# Patient Record
Sex: Female | Born: 1945
Health system: Southern US, Community
[De-identification: ages and names within clinical notes are randomized; demographics above are authoritative.]

## PROBLEM LIST (undated history)

## (undated) DIAGNOSIS — M1990 Unspecified osteoarthritis, unspecified site: Secondary | ICD-10-CM

## (undated) DIAGNOSIS — G43909 Migraine, unspecified, not intractable, without status migrainosus: Secondary | ICD-10-CM

## (undated) DIAGNOSIS — R55 Syncope and collapse: Secondary | ICD-10-CM

## (undated) DIAGNOSIS — M810 Age-related osteoporosis without current pathological fracture: Secondary | ICD-10-CM

## (undated) DIAGNOSIS — M5136 Other intervertebral disc degeneration, lumbar region: Secondary | ICD-10-CM

## (undated) DIAGNOSIS — K648 Other hemorrhoids: Secondary | ICD-10-CM

## (undated) DIAGNOSIS — N814 Uterovaginal prolapse, unspecified: Secondary | ICD-10-CM

## (undated) DIAGNOSIS — K449 Diaphragmatic hernia without obstruction or gangrene: Secondary | ICD-10-CM

## (undated) DIAGNOSIS — B029 Zoster without complications: Secondary | ICD-10-CM

## (undated) DIAGNOSIS — T7840XA Allergy, unspecified, initial encounter: Secondary | ICD-10-CM

## (undated) DIAGNOSIS — E785 Hyperlipidemia, unspecified: Secondary | ICD-10-CM

## (undated) DIAGNOSIS — C4491 Basal cell carcinoma of skin, unspecified: Secondary | ICD-10-CM

## (undated) DIAGNOSIS — H11009 Unspecified pterygium of unspecified eye: Secondary | ICD-10-CM

## (undated) DIAGNOSIS — M797 Fibromyalgia: Secondary | ICD-10-CM

## (undated) DIAGNOSIS — Z86010 Personal history of colonic polyps: Secondary | ICD-10-CM

## (undated) DIAGNOSIS — I480 Paroxysmal atrial fibrillation: Secondary | ICD-10-CM

## (undated) DIAGNOSIS — D649 Anemia, unspecified: Secondary | ICD-10-CM

## (undated) DIAGNOSIS — R3129 Other microscopic hematuria: Secondary | ICD-10-CM

## (undated) DIAGNOSIS — K219 Gastro-esophageal reflux disease without esophagitis: Secondary | ICD-10-CM

## (undated) DIAGNOSIS — J189 Pneumonia, unspecified organism: Secondary | ICD-10-CM

## (undated) DIAGNOSIS — J111 Influenza due to unidentified influenza virus with other respiratory manifestations: Secondary | ICD-10-CM

## (undated) DIAGNOSIS — K644 Residual hemorrhoidal skin tags: Secondary | ICD-10-CM

## (undated) DIAGNOSIS — K5790 Diverticulosis of intestine, part unspecified, without perforation or abscess without bleeding: Secondary | ICD-10-CM

## (undated) HISTORY — PX: COLONOSCOPY: SHX174

## (undated) HISTORY — PX: CERVICAL CONE BIOPSY: SUR198

## (undated) HISTORY — DX: Zoster without complications: B02.9

## (undated) HISTORY — DX: Diaphragmatic hernia without obstruction or gangrene: K44.9

## (undated) HISTORY — PX: CHOLECYSTECTOMY: SHX55

## (undated) HISTORY — DX: Unspecified osteoarthritis, unspecified site: M19.90

## (undated) HISTORY — PX: OTHER SURGICAL HISTORY: SHX169

## (undated) HISTORY — DX: Allergy, unspecified, initial encounter: T78.40XA

## (undated) HISTORY — DX: Fibromyalgia: M79.7

## (undated) HISTORY — PX: ABDOMINAL HYSTERECTOMY: SHX81

## (undated) HISTORY — DX: Basal cell carcinoma of skin, unspecified: C44.91

---

## 1999-12-28 DIAGNOSIS — M5136 Other intervertebral disc degeneration, lumbar region: Secondary | ICD-10-CM

## 1999-12-28 DIAGNOSIS — M51369 Other intervertebral disc degeneration, lumbar region without mention of lumbar back pain or lower extremity pain: Secondary | ICD-10-CM

## 1999-12-28 HISTORY — DX: Other intervertebral disc degeneration, lumbar region: M51.36

## 1999-12-28 HISTORY — DX: Other intervertebral disc degeneration, lumbar region without mention of lumbar back pain or lower extremity pain: M51.369

## 2000-03-22 ENCOUNTER — Encounter: Payer: Self-pay | Admitting: Obstetrics and Gynecology

## 2000-03-22 ENCOUNTER — Ambulatory Visit (HOSPITAL_COMMUNITY): Admission: RE | Admit: 2000-03-22 | Discharge: 2000-03-22 | Payer: Self-pay | Admitting: Obstetrics and Gynecology

## 2000-11-12 ENCOUNTER — Encounter: Payer: Self-pay | Admitting: *Deleted

## 2000-11-12 ENCOUNTER — Encounter: Payer: Self-pay | Admitting: Family Medicine

## 2000-11-12 ENCOUNTER — Inpatient Hospital Stay (HOSPITAL_COMMUNITY): Admission: EM | Admit: 2000-11-12 | Discharge: 2000-11-18 | Payer: Self-pay | Admitting: *Deleted

## 2000-11-12 ENCOUNTER — Encounter (INDEPENDENT_AMBULATORY_CARE_PROVIDER_SITE_OTHER): Payer: Self-pay | Admitting: *Deleted

## 2000-11-14 ENCOUNTER — Encounter: Payer: Self-pay | Admitting: Family Medicine

## 2002-04-18 ENCOUNTER — Emergency Department (HOSPITAL_COMMUNITY): Admission: EM | Admit: 2002-04-18 | Discharge: 2002-04-19 | Payer: Self-pay | Admitting: Emergency Medicine

## 2005-08-16 ENCOUNTER — Ambulatory Visit: Payer: Self-pay | Admitting: Internal Medicine

## 2005-08-23 ENCOUNTER — Encounter (INDEPENDENT_AMBULATORY_CARE_PROVIDER_SITE_OTHER): Payer: Self-pay | Admitting: Specialist

## 2005-08-23 ENCOUNTER — Ambulatory Visit: Payer: Self-pay | Admitting: Internal Medicine

## 2005-12-27 DIAGNOSIS — B029 Zoster without complications: Secondary | ICD-10-CM

## 2005-12-27 HISTORY — DX: Zoster without complications: B02.9

## 2008-10-07 ENCOUNTER — Encounter: Admission: RE | Admit: 2008-10-07 | Discharge: 2008-10-07 | Payer: Self-pay | Admitting: Family Medicine

## 2011-05-03 ENCOUNTER — Emergency Department (HOSPITAL_COMMUNITY)
Admission: EM | Admit: 2011-05-03 | Discharge: 2011-05-04 | Disposition: A | Discharge status: Home / Self Care | Payer: 59 | Attending: Emergency Medicine | Admitting: Emergency Medicine

## 2011-05-03 ENCOUNTER — Emergency Department (HOSPITAL_COMMUNITY): Payer: 59

## 2011-05-03 DIAGNOSIS — W2209XA Striking against other stationary object, initial encounter: Secondary | ICD-10-CM | POA: Insufficient documentation

## 2011-05-03 DIAGNOSIS — S92919A Unspecified fracture of unspecified toe(s), initial encounter for closed fracture: Secondary | ICD-10-CM | POA: Insufficient documentation

## 2011-05-03 DIAGNOSIS — IMO0001 Reserved for inherently not codable concepts without codable children: Secondary | ICD-10-CM | POA: Insufficient documentation

## 2011-05-03 DIAGNOSIS — Y92009 Unspecified place in unspecified non-institutional (private) residence as the place of occurrence of the external cause: Secondary | ICD-10-CM | POA: Insufficient documentation

## 2011-05-14 NOTE — Procedures (Signed)
[place]  Patient:    Alicia Bass, Alicia Bass                       MRN: [id number] Proc. Date: 11/14/00 Adm. Date:  [id number] Attending:  Lonia Blood CC:         Dario Guardian, M.D.   Procedure Report  PROCEDURE PERFORMED:  Upper endoscopy with biopsies.  INDICATION:  A 65 year old female recently admitted to the hospital with nonspecific chest pain, who ruled out for an MI and has been having a lot of belching.  Her pain has not improved on TPI therapy.  She also has recently been having diarrhea which is unusual for her.  FINDINGS:  A small hiatal hernia, otherwise normal exam.  DESCRIPTION OF PROCEDURE:  The nature, purpose and risks of the procedure have been discussed with the patient who provided written consent.  She had already received some morphine up on the floor, so Versed 4 mg IV was administered as sedation without arrhythmias or desaturation.  The Olympus video endoscope was passed under direct vision.  The vocal cords looked normal but there was slight erythema of the posterior commissure along with a little bit of edema there, suggesting possible laryngopharyngeal reflux.  The esophagus was entered without difficulty and had normal mucosa without evidence of reflux esophagus, Barretts esophagus, varices, infection or neoplasia.  No ring or stricture was present but there was approximately a 2 to 3 cm hiatal hernia best seen on retroflex viewing in the stomach.  Upon entering the stomach, there was essentially no residual and what appeared to be normal contractility.  No erosions, ulcers, polyps, masses or gastritis were appreciated.  Retroflex viewing apart from the hiatal hernia was normal. The pylorus and duodenal bulb looked normal.  The second duodenum looked like it had a slight decrease in the villous pattern of the mucosa.  Several duodenal biopsies were obtained prior to removing the scope.  The patient  tolerated the procedure well and there were no apparent complications.  IMPRESSION: 1. No obvious evident source of chest and abdominal discomfort identified. 2. Small hiatal hernia. 3. Possible laryngeal changes consistent with laryngopharyngeal reflux.    However, that condition is not really compatible with the character    of her symptoms.  PLAN:  Await pathology on duodenal biopsies.  Continue evaluation for possible biliary tract disease (doubt).  Moreover, I would consider rheumatologic evaluation for musculoskeletal sources of her pain. DD:  11/14/00 TD:  11/15/00 Job: [id number]

## 2011-05-14 NOTE — Consult Note (Signed)
[place]. [facility]  Patient:    Alicia Bass, Alicia Bass                       MRN: [id number] Proc. Date: 11/14/00 Adm. Date:  [id number] Attending:  Lonia Blood CC:         Dario Guardian, M.D.   Consultation Report  REASON FOR CONSULTATION:  Dr. Anastasio Auerbach of the [facility] asked me to see this 65 year old female because of nonspecific chest and abdominal pain.  HISTORY:  Ms. Salo was admitted to the hospital two days ago for rule out MI, and in fact has ruled out with negative cardiac enzymes.  Approximately two days prior to admission, she had the onset of xiphoid-regional chest pain with some radiation to the back, occurring a few hours after consuming a steak dinner with a glass of wine at a restaurant. The pain lasted six hours and kept her from going to sleep until about 3 a.m. and was quite severe.  There was some residual pain the next day, even in the absence of food.  At no time were there fever or chills, but she has noticed a lot of increased belching over the past month or so.  There was no associated diaphoresis, nausea, or vomiting.  Since that time, the patient has had some pain involving the right flank and down the right side of her abdomen and, for the past two to three days, also loose but nonbloody stools.  It sounds as though the patient has had an abdominal ultrasound, although I am unable to find documentation of it; it was reportedly negative.  She also has had CT scan of the abdomen which was essentially negative for any source of pain.  Because of the ongoing character of her symptoms, the presence of belching, the absence of improvement on Protonix in the hospital, endoscopy evaluation has been requested by Dr. Sheppard Penton.  PAST MEDICAL HISTORY:  ALLERGIES:  ASPIRIN.  OUTPATIENT MEDICATIONS:  None.  OPERATIONS:  Total abdominal hysterectomy.  MEDICAL PROBLEMS:  Fibromyalgia, not requiring trigger point  injection.  HABITS:  Nonsmoker, occasional ethanol.  FAMILY HISTORY:  Positive for gallbladder trouble in her sister and ulcers in her brother; negative for colon cancer or liver disease.  SOCIAL HISTORY:  The patient is married with two children.  REVIEW OF SYSTEMS:  No problem with constipation or obvious heartburn or regurgitation problems, despite the belching.  No history of chronic abdominal pain.  PHYSICAL EXAMINATION:  Unrevealing.  GENERAL:  A pleasant female in no evident distress.  Her pupils are small status post morphine injection, but she is very alert, awake, and coherent. She is anicteric and without pallor.  CHEST:  Clear.  HEART:  Without gallops, rubs, murmurs, clicks, or arrhythmias.  ABDOMEN:  Active bowel sounds and no organomegaly, guarding, mass, or tenderness.  LABORATORY DATA:  Hemoglobin 11.1, white count 4500.  Platelets 217,000. Liver chemistry normal x 2.  Renal function normal.  Lipase normal.  IMPRESSION: 1. Upper abdominal pain with some radiation to the back in a patient with a    family history of gallstones. 2. Recent belching of about one months duration without frank heartburn or    reflux. 3. Diarrhea of roughly two days duration. 4. Background diagnosis of fibromyalgia.  PLAN:  I agree with Dr. Sheppard Penton that endoscopy evaluation is reasonable to help sort things out, with further management to depend on those findings.  Also, we need  to confirm whether or not the patient has indeed had an abdominal ultrasound because, if not, I think there is a reasonable possibility that this is biliary tract disease in view of the character, duration, and location of the pain and the family history of gallbladder problems.  Finally, the patient is a candidate for colon cancer screening procedure, such as hemoccults and flexible sigmoidoscopy, if these have not already been performed through the office of her primary physician. DD:  11/14/00 TD:   11/15/00 Job: [id number]

## 2011-05-14 NOTE — Consult Note (Signed)
[place]  Patient:    KENNADY, ZIMMERLE                       MRN: [id number] Proc. Date: 11/15/00 Adm. Date:  [id number] Attending:  Lonia Blood CC:         Dario Guardian, M.D.  Florencia Reasons, M.D.  Miguel Aschoff, M.D.   Consultation Report  HISTORY OF PRESENT ILLNESS:  I was asked by Dr. Mayford Knife to evaluate Ms. Rouse for persistent abdominal pain.  This is a nice 65 year old white female who 1 week ago after eating a meal of steak and potatoes developed a fairly rapid onset of epigastric pain.  She describes a cramping or sharp pain in her low substernum and epigastrium which radiated down along her right flank to her low right back.  This has not been associated with any nausea or vomiting.  The pain persisted for about 24 hours and she did not feel like eating.  The pain then improved and seemed like it was resolving but again, after eating a high fat meal, on the day of admission, she developed recurrent pain and presented to the emergency room.  The patient has been having some mild similar discomfort for about a month. She reports that she had an episode of epigastric pain about 20 years ago and had an upper GI series and was told she had a hiatal hernia.  She, however, has not had any significant abdominal pain or GI symptoms for many years until this illness.  Since admission her pain has been fairly persistent although relieved with medications.  She has had onset of diarrhea, just prior admission which has persisted as well.  She denies fever or chills.  No evidence of jaundice, no urinary symptoms.  Since admission she has had a extensive work-up.  LFTs have all been normal. CBC is normal with a white count of 4.5, and hematocrit somewhat low at 33%. Urinalysis was negative.  Cardiac work-up was negative.  Lipase normal at 28.  X-ray work-up includes a normal abdominal ultrasound, a normal CT scan of  the abdomen and pelvis and a normal HIDA scan.  She has been evaluated by Dr. Matthias Hughs and an upper endoscopy has been performed which revealed small hiatal hernia and some slight laryngeal irritation possibly consistent with reflux although no definite cause for her pain was identified.  Duodenal biopsy was obtained which is pending.  PAST MEDICAL HISTORY:  She is followed by Dr. Dario Guardian.  She has a diagnosis of fibromyalgia, and has had some extremity pain but is on no medication for this.  She has had a vaginal hysterectomy.  She is on no regular medications prior to admission.  ALLERGIES:  ASPIRIN.  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  Drinks occasional small amounts of alcohol.  Does not smoke cigarettes.  FAMILY HISTORY:  Significant for a sister with gallbladder history.  PHYSICAL EXAMINATION:  VITAL SIGNS:  She is afebrile.  All within normal limits.  GENERAL:  She is a well-developed white female and does not appear in acute distress.  SKIN:  Warm and dry without rash or infection.  HEENT:  No adenopathy.  Sclerae nonicteric.  LUNGS:  Clear to auscultation.  CARDIAC:  Regular rate and rhythm without murmurs.  ABDOMEN:  Soft and nondistended.  A very mild epigastric tenderness with no guarding.  No palpable masses or hepatosplenomegaly.  ASSESSMENT AND PLAN:  Abdominal pain which is certainly consistent with biliary pain in its location and character.  She has had a negative extensive work-up.  She certainly could have a calculus cholecystitis or biliary dyskinesia.  At this point she is fairly early in her acute illness and I would not recommend surgical intervention at this point with a negative work up.  I would favor continued treatment with acid reduction and symptomatic treatment for her pain and close follow up.  Certainly if this becomes a persistent problem for her over the next several weeks or a recurring problem with no  other cause identified, consideration could be given empiric laparoscopic cholecystectomy. I will remain available as needed. DD:  11/15/00 TD:  11/16/00 Job: [id number]

## 2011-05-14 NOTE — Discharge Summary (Signed)
[place]  Patient:    Alicia Bass, Alicia Bass                       MRN: [id number] Adm. Date:  [id number] Disc. Date: [id number] Attending:  Lonia Blood CC:         Dario Guardian, M.D.  Florencia Reasons, M.D.  Lorne Skeens. [place], M.D.   Discharge Summary  DATE OF BIRTH:  01-02-1946  DISCHARGE DIAGNOSES: 1. Severe abdominal pain.    A. Etiology unclear.    B. Extensive gastrointestinal workup without significant abnormality. 2. Chest and rib pain - ? costochondritis. 3. Hiatal hernia. 4. Laryngopharyngeal reflux by endoscopy, suggested. 5. Mild anemia.    A. Admission hemoglobin 23.4, discharge hemoglobin 11.4.    B. Question secondary to acute illness. 6. Diarrhea, resolved - stool studies negative. 7. Fibromyalgia - primarily presents with extremity pain. 8. Status post vaginal hysterectomy. 9. Allergy/intolerance:  ASPIRIN (abdominal pain).  DISCHARGE MEDICATIONS: 1. Reglan 5 mg one p.o. q.a.c. and h.s. (q.i.d.). 2. Protonix 40 mg p.o. b.i.d. x 5 days, then q.d. 3. Vioxx 25 mg two p.o. q.d. x 5 days, then q.d. x 5 days.  (The patient is    to see Dr. Katrinka Blazing before taking any more than that.) 4. Use Tylenol for breakthrough pain, avoid aspirin and Motrin-like products    except for Vioxx listed above.  CONDITION UPON DISCHARGE:  Stable.  Alicia Bass is finally off IV pain medications and is tolerating liquids.  DISCHARGE INSTRUCTIONS:  Recommended activity:  As tolerated.  Recommended diet:  Small frequent meals.  SPECIAL DISCHARGE INSTRUCTIONS:  Call Dr. Katrinka Blazing or return if symptoms recur.  DISCHARGE FOLLOWUP:  The patient is to call Dr. Betsy Pries office to schedule and appointment to be seen next Thursday to reevaluate pain.  She should also have hemoglobin repeated in approximately two to three weeks to ensure that the anemia has resolved.  CONSULTANTS: 1. Robert Buccini. 2. Glenna Fellows.  PROCEDURES: 1. EKG (November 17):  Normal sinus rhythm with occasional PVC, otherwise    normal. 2. Chest x-ray (November 17):  Hyperinflation without acute or superimposed    abnormality. 3. Abdominal and pelvic CT with contrast (November 17):  A few tiny (5 mm or    less) lucencies of the liver that are probably cysts.  Unlikely of clinical    significance.  Pelvic CT reveals no abnormalities. 4. Abdominal ultrasound (November 19):  Normal. 5. Hepatobiliary scan (November 21):  Normal. 6. Lumbar spine films (November 20):  Osteopenia.  No compression fractures or    subluxations.  Minimal osteophytes in the thoracic spine.  Mild facet    degenerative disease at L4-L5 and L5-S1. 7. Barium swallow (November 21):  Negative.  No evidence of obstruction or    abnormal peristalsis.  Barium tablets are not available, but the patient    swallowed a coated marshmallow without difficulty. 8. Esophagogastroduodenoscopy (November 19):  Essentially normal exam.  There    was as slight increase in the duodenal villi (biopsy showed no microscopic    abnormality).  There was also laryngeal inflammation.  No evident cause for    chest or abdominal pain seen.  HOSPITAL COURSE: #1 -  ABDOMINAL AND CHEST PAIN:  Alicia Bass is a 65 year old Caucasian female who presents with nonspecific lower chest and upper abdominal pain.  EKG and cardiac enzymes done on admission were negative.  Her pain began  approximately three days prior to presentation after she had eaten a meal.  It had been intermittent, but got to the point where she was doubled over with discomfort today.  She had transient nausea and vomiting, but no shortness of breath or diaphoresis.  She also had some diarrhea associated with her symptoms. Abdominal CT was obtained on admission which showed no significant abnormalities.  This was followed by abdominal ultrasound looking for biliary disease given the nature of her story.  This  was also negative.  During this time, she was still having significant pain requiring IV pain medications. Because of our concerns for acalculous cholecystitis, we did obtain a PIPIDA scan which was also normal.  Occasionally people can have biliary colic without abnormal tests.  I consulted Dr. Glenna Fellows, general surgeon, to review the patient and studies done.  Persistence in this workup was driven by the fact that Alicia Bass was still having severe colicky pain requiring IV pain medication, and she was not eating or drinking.  Dr. Johna Sheriff felt that she certainly could have acalculous cholecystitis or biliary dyskinesia, but felt that she was early in her acute illness and would not recommend surgical intervention at this time.  He recommended continuing treatment with acid reduction and symptomatic treatment for her pain.  If it becomes a persistent problem over the next several weeks or a recurrent problem without other cause identifying data, he would consider empiric laparoscopic cholecystectomy.  Alicia Bass continued to have severe pain and on the morning of November 21, she told me it was worse than it had ever been. I decided to order a barium swallow at that time to look for evidence of esophageal spasm as a possible cause for her colicky type pain which was in the anterior sternal area radiating through to the back.  This was normal.  She also had persistent belching with this.  Because of the small hiatal hernia, I did opt to place her on some Reglan, and of course, we continued her IV pain medication as nothing else oral was helping.  Over the next 48 hours her symptoms slowly improved, and at the time of discharge she was tolerating liquids and the pain was manageable with oral medications.  Of note, she did have some chest wall tenderness associated with this, and we opted to place her on an  anti-inflammatory as well (Vioxx).  She will follow up with Dr. Merri Brunette in one week and have symptoms reassessed.  #2 - CHEST WALL PAIN:  Possible costochondritis.  I began anti-inflammatory treatment.  She is also on Protonix for above-listed reasons.  #3 - ANEMIA:  Alicia Bass had a hemoglobin of 13.4 on admission with an MCV of 87.  Throughout this hospital stay, it did drop to 11.4.  She had no evidence of bleeding.  Follow up as an outpatient. DD:  11/18/00 TD:  11/19/00 Job: [id number]

## 2011-12-01 ENCOUNTER — Ambulatory Visit (INDEPENDENT_AMBULATORY_CARE_PROVIDER_SITE_OTHER): Payer: 59

## 2011-12-01 DIAGNOSIS — Z0000 Encounter for general adult medical examination without abnormal findings: Secondary | ICD-10-CM

## 2011-12-01 DIAGNOSIS — Z13220 Encounter for screening for lipoid disorders: Secondary | ICD-10-CM

## 2011-12-01 DIAGNOSIS — R5381 Other malaise: Secondary | ICD-10-CM

## 2011-12-01 DIAGNOSIS — IMO0001 Reserved for inherently not codable concepts without codable children: Secondary | ICD-10-CM

## 2012-02-21 ENCOUNTER — Other Ambulatory Visit: Payer: Self-pay | Admitting: Dermatology

## 2012-09-25 ENCOUNTER — Ambulatory Visit: Payer: 59 | Admitting: Internal Medicine

## 2012-12-12 ENCOUNTER — Ambulatory Visit (INDEPENDENT_AMBULATORY_CARE_PROVIDER_SITE_OTHER): Payer: 59 | Admitting: Physician Assistant

## 2012-12-12 ENCOUNTER — Encounter: Payer: Self-pay | Admitting: Physician Assistant

## 2012-12-12 VITALS — BP 127/74 | HR 64 | Temp 97.8°F | Resp 16 | Ht 62.75 in | Wt 138.0 lb

## 2012-12-12 DIAGNOSIS — N8111 Cystocele, midline: Secondary | ICD-10-CM

## 2012-12-12 DIAGNOSIS — M8580 Other specified disorders of bone density and structure, unspecified site: Secondary | ICD-10-CM

## 2012-12-12 DIAGNOSIS — IMO0001 Reserved for inherently not codable concepts without codable children: Secondary | ICD-10-CM

## 2012-12-12 DIAGNOSIS — Z0000 Encounter for general adult medical examination without abnormal findings: Secondary | ICD-10-CM

## 2012-12-12 DIAGNOSIS — K449 Diaphragmatic hernia without obstruction or gangrene: Secondary | ICD-10-CM | POA: Insufficient documentation

## 2012-12-12 DIAGNOSIS — IMO0002 Reserved for concepts with insufficient information to code with codable children: Secondary | ICD-10-CM

## 2012-12-12 DIAGNOSIS — M797 Fibromyalgia: Secondary | ICD-10-CM | POA: Insufficient documentation

## 2012-12-12 DIAGNOSIS — N8110 Cystocele, unspecified: Secondary | ICD-10-CM | POA: Insufficient documentation

## 2012-12-12 DIAGNOSIS — M810 Age-related osteoporosis without current pathological fracture: Secondary | ICD-10-CM | POA: Insufficient documentation

## 2012-12-12 HISTORY — PX: COLONOSCOPY W/ POLYPECTOMY: SHX1380

## 2012-12-12 LAB — POCT URINALYSIS DIPSTICK
Bilirubin, UA: NEGATIVE
Nitrite, UA: NEGATIVE
Protein, UA: NEGATIVE
pH, UA: 6.5

## 2012-12-12 LAB — CBC WITH DIFFERENTIAL/PLATELET
Basophils Absolute: 0 10*3/uL (ref 0.0–0.1)
Basophils Relative: 1 % (ref 0–1)
Eosinophils Absolute: 0.1 10*3/uL (ref 0.0–0.7)
Hemoglobin: 13.9 g/dL (ref 12.0–15.0)
MCH: 30.3 pg (ref 26.0–34.0)
MCHC: 33.4 g/dL (ref 30.0–36.0)
Monocytes Relative: 8 % (ref 3–12)
Neutro Abs: 2.4 10*3/uL (ref 1.7–7.7)
Neutrophils Relative %: 52 % (ref 43–77)
Platelets: 255 10*3/uL (ref 150–400)
RDW: 13.1 % (ref 11.5–15.5)

## 2012-12-12 LAB — COMPREHENSIVE METABOLIC PANEL
ALT: 30 U/L (ref 0–35)
AST: 24 U/L (ref 0–37)
Chloride: 108 mEq/L (ref 96–112)
Creat: 0.83 mg/dL (ref 0.50–1.10)
Sodium: 142 mEq/L (ref 135–145)
Total Bilirubin: 0.6 mg/dL (ref 0.3–1.2)
Total Protein: 6.3 g/dL (ref 6.0–8.3)

## 2012-12-12 LAB — POCT UA - MICROSCOPIC ONLY: Yeast, UA: NEGATIVE

## 2012-12-12 LAB — TSH: TSH: 1.591 u[IU]/mL (ref 0.350–4.500)

## 2012-12-12 LAB — LIPID PANEL
Total CHOL/HDL Ratio: 3.6 Ratio
VLDL: 21 mg/dL (ref 0–40)

## 2012-12-12 NOTE — Patient Instructions (Signed)
Take the prescription for Zostavax (the shingles vaccine) to [facility], [facility] or [facility].  Keeping You Healthy  Get These Tests  Blood Pressure- Have your blood pressure checked by your healthcare provider at least once a year.  Normal blood pressure is 120/80.  Weight- Have your body mass index (BMI) calculated to screen for obesity.  BMI is a measure of body fat based on height and weight.  You can calculate your own BMI at https://www.west-esparza.com/  Cholesterol- Have your cholesterol checked every year.  Diabetes- Have your blood sugar checked every year if you have high blood pressure, high cholesterol, a family history of diabetes or if you are overweight.  Pap Smear- Have a pap smear every 1 to 3 years if you have been sexually active.  If you are older than 65 and recent pap smears have been normal you may not need additional pap smears.  In addition, if you have had a hysterectomy  For benign disease additional pap smears are not necessary.  Mammogram-Yearly mammograms are essential for early detection of breast cancer  Screening for Colon Cancer- Colonoscopy starting at age 64. Screening may begin sooner depending on your family history and other health conditions.  Follow up colonoscopy as directed by your Gastroenterologist.  Screening for Osteoporosis- Screening begins at age 64 with bone density scanning, sooner if you are at higher risk for developing Osteoporosis.  Get these medicines  Calcium with Vitamin D- Your body requires 1200-1500 mg of Calcium a day and [phone] IU of Vitamin D a day.  You can only absorb 500 mg of Calcium at a time therefore Calcium must be taken in 2 or 3 separate doses throughout the day.  Hormones- Hormone therapy has been associated with increased risk for certain cancers and heart disease.  Talk to your healthcare provider about if you need relief from menopausal symptoms.  Aspirin- Ask your healthcare provider about taking Aspirin  to prevent Heart Disease and Stroke.  Get these Immuniztions  Flu shot- Every fall  Pneumonia shot- Once after the age of 50; if you are younger ask your healthcare provider if you need a pneumonia shot.  Tetanus- Every ten years.  Zostavax- Once after the age of 64 to prevent shingles.  Take these steps  Don't smoke- Your healthcare provider can help you quit. For tips on how to quit, ask your healthcare provider or go to www.smokefree.gov or call 1-800 QUIT-NOW.  Be physically active- Exercise 5 days a week for a minimum of 30 minutes.  If you are not already physically active, start slow and gradually work up to 30 minutes of moderate physical activity.  Try walking, dancing, bike riding, swimming, etc.  Eat a healthy diet- Eat a variety of healthy foods such as fruits, vegetables, whole grains, low fat milk, low fat cheeses, yogurt, lean meats, chicken, fish, eggs, dried beans, tofu, etc.  For more information go to www.thenutritionsource.org  Dental visit- Brush and floss teeth twice daily; visit your dentist twice a year.  Eye exam- Visit your Optometrist or Ophthalmologist yearly.  Drink alcohol in moderation- Limit alcohol intake to one drink or less a day.  Never drink and drive.  Depression- Your emotional health is as important as your physical health.  If you're feeling down or losing interest in things you normally enjoy, please talk to your healthcare provider.  Seat Belts- can save your life; always wear one  Smoke/Carbon Monoxide detectors- These detectors need to be installed on the appropriate  level of your home.  Replace batteries at least once a year.  Violence- If anyone is threatening or hurting you, please tell your healthcare provider.  Living Will/ Health care power of attorney- Discuss with your healthcare provider and family.

## 2012-12-12 NOTE — Progress Notes (Signed)
Subjective:    Patient ID: Alicia Bass, female    DOB: 1946-10-28, 66 y.o.   MRN: [id number]  HPI  This 66 y.o. female presents for Annual Wellness Examination.  Overall, she's doing well, but complains of pain in both elbows and the RIGHT upper arm.  No numbness or tingling, no weakness.  "I think it's the fibromyalgia" but she recalls hitting her LEFT elbow on a table last week. She's done that before, too.  She is left hand dominant and works as a Dispensing optician at [employer].  Colonoscopy 08/21/2005. Mammogram 2011.  Past Medical History  Diagnosis Date  . Fibromyalgia   . Basal cell carcinoma     LEFT anterior chest wall  . Shingles 2007  . Hiatal hernia     Past Surgical History  Procedure Date  . Abdominal hysterectomy   . Gallbladder surgery     Prior to Admission medications   Medication Sig Start Date End Date Taking? Authorizing Provider  Ascorbic Acid (VITAMIN C PO) Take by mouth daily.   Yes Historical Provider, MD  aspirin 81 MG tablet Take 81 mg by mouth daily.   Yes Historical Provider, MD  BIOTIN PO Take by mouth daily.   Yes Historical Provider, MD  Cholecalciferol (VITAMIN D PO) Take by mouth daily.   Yes Historical Provider, MD  glucosamine-chondroitin 500-400 MG tablet Take 1 tablet by mouth 3 (three) times daily.   Yes Historical Provider, MD  Multiple Vitamin (MULTIVITAMIN) tablet Take 1 tablet by mouth daily.   Yes Historical Provider, MD    Allergies  Allergen Reactions  . Codeine     History   Social History  . Marital Status: Married    Spouse Name: Nafeesa Dils    Number of Children: 5  . Years of Education: 12   Occupational History  . hairdresser    Social History Main Topics  . Smoking status: Never Smoker   . Smokeless tobacco: Never Used  . Alcohol Use: Yes     Comment: 1-2 annually  . Drug Use: No  . Sexually Active: Yes -- Female partner(s)    Birth Control/ Protection: Surgical   Other Topics  Concern  . Not on file   Social History Narrative   Lives with husband.  Square dances and rides motorcycles with her husband. Her daughter and 2 grandchildren live with them.  Her son died by suicide at age 13.  Her husband also has 3 children from a previous relationship.    Family History  Problem Relation Age of Onset  . Heart disease Mother   . Cancer Father   . Cancer Sister     "full blown cancer," "it started as a squeamish on her head"  . Heart disease Sister   . Diabetes Sister   . Heart disease Brother   . Early death Son   . Cancer Maternal Uncle   . Kidney disease Paternal Aunt   . Stroke Sister   . Heart disease Sister     Review of Systems As above, No chest pain, SOB, HA, dizziness, vision change, N/V, diarrhea, constipation, dysuria, urinary urgency or frequency, or rash.     Objective:   Physical Exam  Vitals reviewed. Constitutional: She is oriented to person, place, and time. Vital signs are normal. She appears well-developed and well-nourished. No distress.  HENT:  Head: Normocephalic and atraumatic.  Right Ear: Hearing, tympanic membrane, external ear and ear canal normal. No foreign bodies.  Left  Ear: Hearing, tympanic membrane, external ear and ear canal normal. No foreign bodies.  Nose: Nose normal.  Mouth/Throat: Uvula is midline, oropharynx is clear and moist and mucous membranes are normal. No oral lesions. Normal dentition. No dental abscesses or uvula swelling. No oropharyngeal exudate.  Eyes: Conjunctivae normal, EOM and lids are normal. Pupils are equal, round, and reactive to light. Right eye exhibits no discharge. Left eye exhibits no discharge. No scleral icterus.  Fundoscopic exam:      The right eye shows no arteriolar narrowing, no AV nicking, no exudate, no hemorrhage and no papilledema. The right eye shows red reflex.The right eye shows no venous pulsations.      The left eye shows no arteriolar narrowing, no AV nicking, no exudate, no  hemorrhage and no papilledema. The left eye shows red reflex.The left eye shows no venous pulsations.   Neck: Trachea normal, normal range of motion and full passive range of motion without pain. Neck supple. No spinous process tenderness and no muscular tenderness present. No mass and no thyromegaly present.  Cardiovascular: Normal rate, regular rhythm, normal heart sounds, intact distal pulses and normal pulses.   Pulmonary/Chest: Effort normal and breath sounds normal. Right breast exhibits no inverted nipple, no mass, no nipple discharge, no skin change and no tenderness. Left breast exhibits no inverted nipple, no mass, no nipple discharge, no skin change and no tenderness. Breasts are symmetrical.  Genitourinary: Rectum normal and vagina normal.  Musculoskeletal: Normal range of motion. She exhibits no edema and no tenderness.       Right shoulder: Normal.       Left shoulder: Normal.       Right elbow: She exhibits normal range of motion, no swelling, no effusion, no deformity and no laceration. tenderness found. Lateral epicondyle tenderness noted. No radial head, no medial epicondyle and no olecranon process tenderness noted.       Left elbow: She exhibits normal range of motion, no swelling, no effusion, no deformity and no laceration. tenderness found. Lateral epicondyle tenderness noted. No radial head, no medial epicondyle and no olecranon process tenderness noted.       Right wrist: Normal.       Left wrist: Normal.       Cervical back: Normal.       Thoracic back: Normal.       Lumbar back: Normal.       Right upper arm: She exhibits tenderness. She exhibits no bony tenderness, no swelling, no edema, no deformity and no laceration.       Left upper arm: Normal.       Right hand: Normal. normal sensation noted. Normal strength noted.       Left hand: Normal. normal sensation noted. Normal strength noted.  Lymphadenopathy:       Head (right side): No tonsillar, no preauricular, no  posterior auricular and no occipital adenopathy present.       Head (left side): No tonsillar, no preauricular, no posterior auricular and no occipital adenopathy present.    She has no cervical adenopathy.    She has no axillary adenopathy.       Right: No supraclavicular adenopathy present.       Left: No supraclavicular adenopathy present.  Neurological: She is alert and oriented to person, place, and time. She has normal strength and normal reflexes. No cranial nerve deficit. She exhibits normal muscle tone. Coordination and gait normal.  Skin: Skin is warm, dry and intact. No rash noted.  She is not diaphoretic. No cyanosis or erythema. Nails show no clubbing.  Psychiatric: She has a normal mood and affect. Her speech is normal and behavior is normal. Judgment and thought content normal.    Results for orders placed in visit on 12/12/12  POCT UA - MICROSCOPIC ONLY      Component Value Range   WBC, Ur, HPF, POC 1-5     RBC, urine, microscopic 0-3     Bacteria, U Microscopic trace     Mucus, UA trace     Epithelial cells, urine per micros 0-5     Crystals, Ur, HPF, POC neg     Casts, Ur, LPF, POC neg     Yeast, UA neg    POCT URINALYSIS DIPSTICK      Component Value Range   Color, UA yellow     Clarity, UA clear     Glucose, UA neg     Bilirubin, UA neg     Ketones, UA neg     Spec Grav, UA 1.025     Blood, UA neg     pH, UA 6.5     Protein, UA neg     Urobilinogen, UA 0.2     Nitrite, UA neg     Leukocytes, UA small (1+)        Assessment & Plan:   1. Routine general medical examination at a health care facility  CBC with Differential, Comprehensive metabolic panel, Lipid panel, TSH; Age appropriate anticipatory guidance provided. She needs an updated Mammogram.  2. Fibromyalgia  Encouraged her to use NSAIDS prn.  3. Bladder cystocele  POCT UA - Microscopic Only, POCT urinalysis dipstick  4. Osteopenia  She needs an updated DEXA

## 2012-12-13 ENCOUNTER — Encounter: Payer: Self-pay | Admitting: Physician Assistant

## 2013-02-19 ENCOUNTER — Other Ambulatory Visit: Payer: Self-pay | Admitting: Dermatology

## 2013-02-24 IMAGING — CR DG TOE 5TH 2+V*L*
3 series · 3 of 3 positions shown · non-contrast
Comparison: None

CLINICAL DATA: Pain, trauma, tenderness fifth toe

LEFT TOE - 2+ VIEW

[t toes ap left]
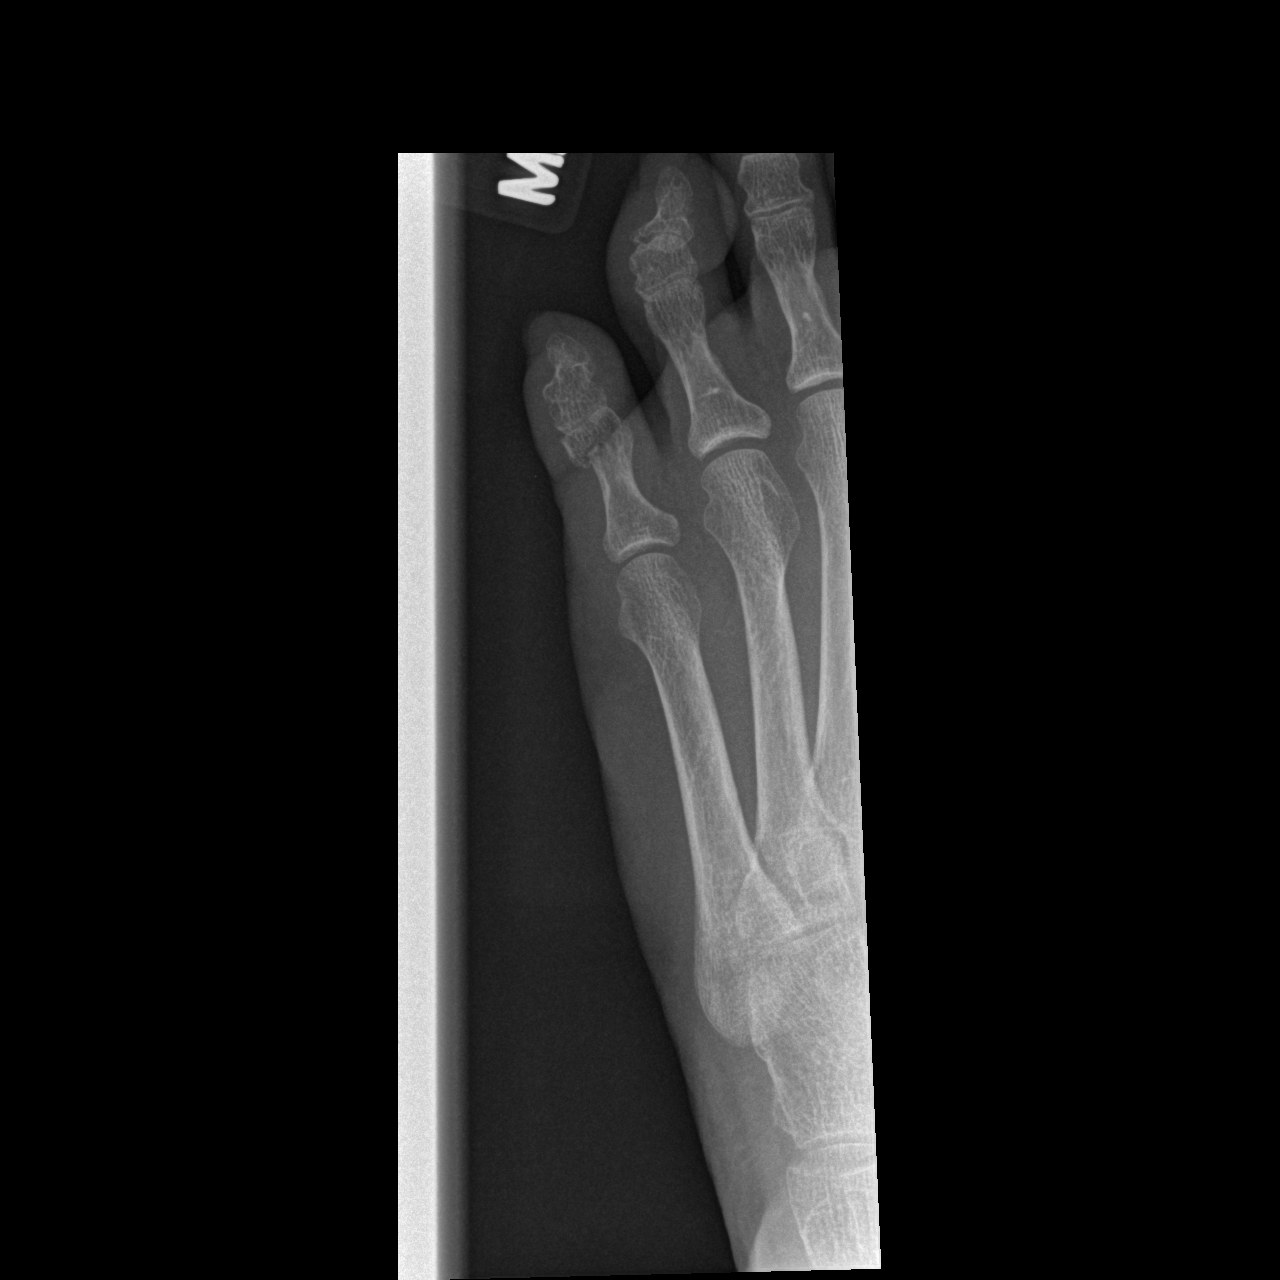

[t toes oblique left]
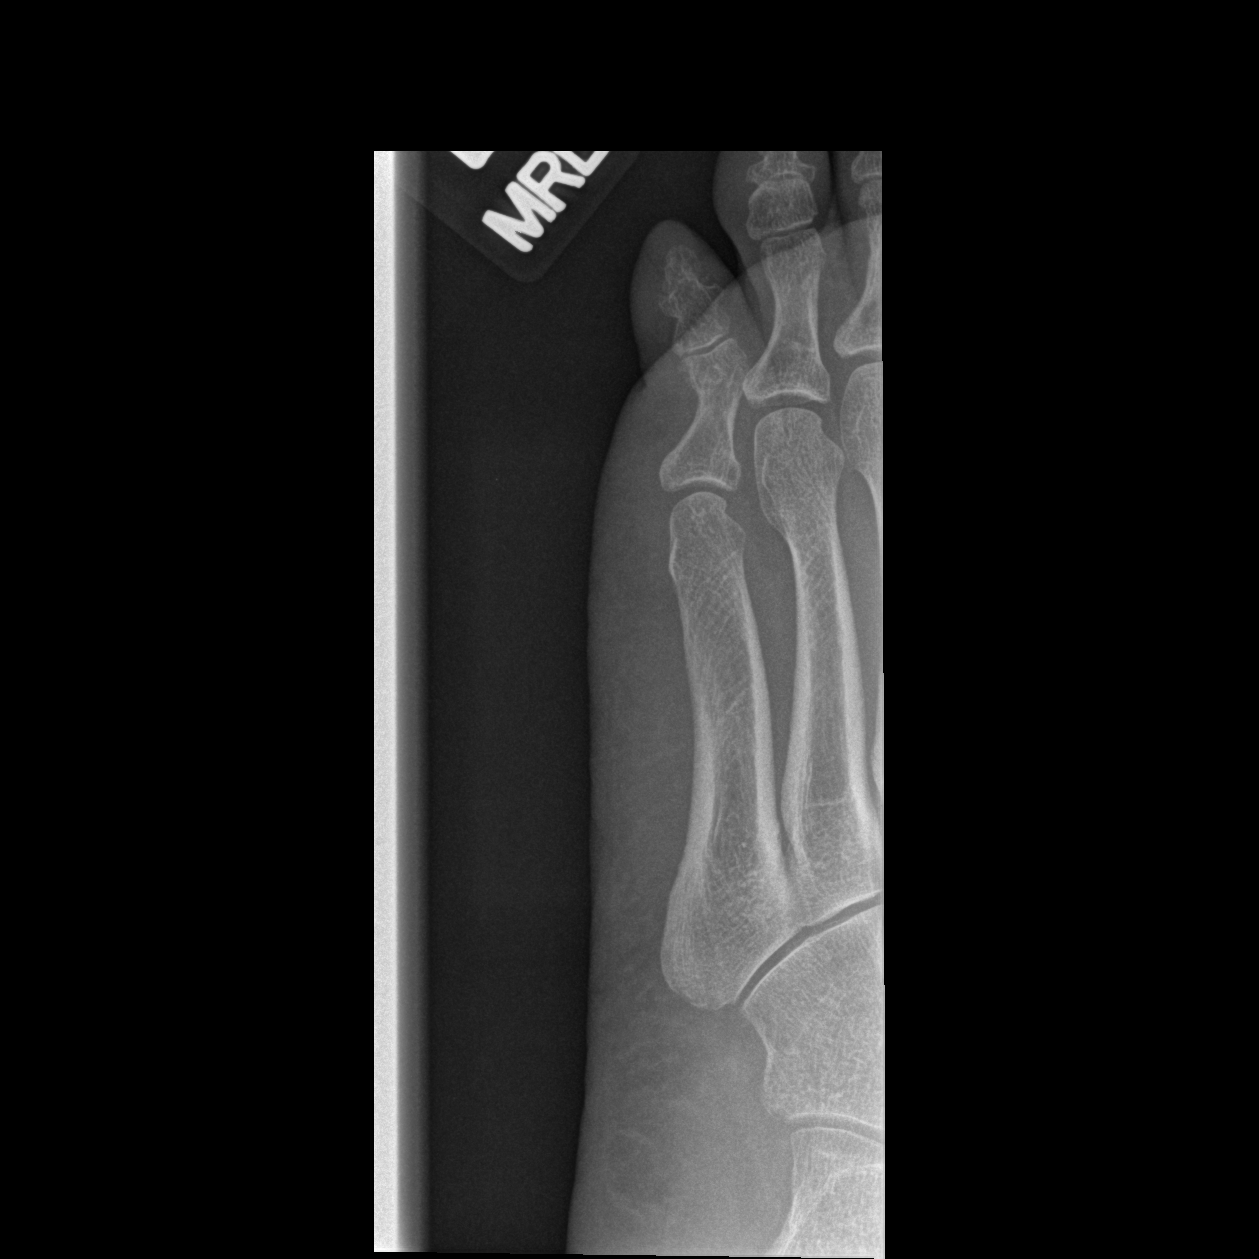

[t toes lateral left]
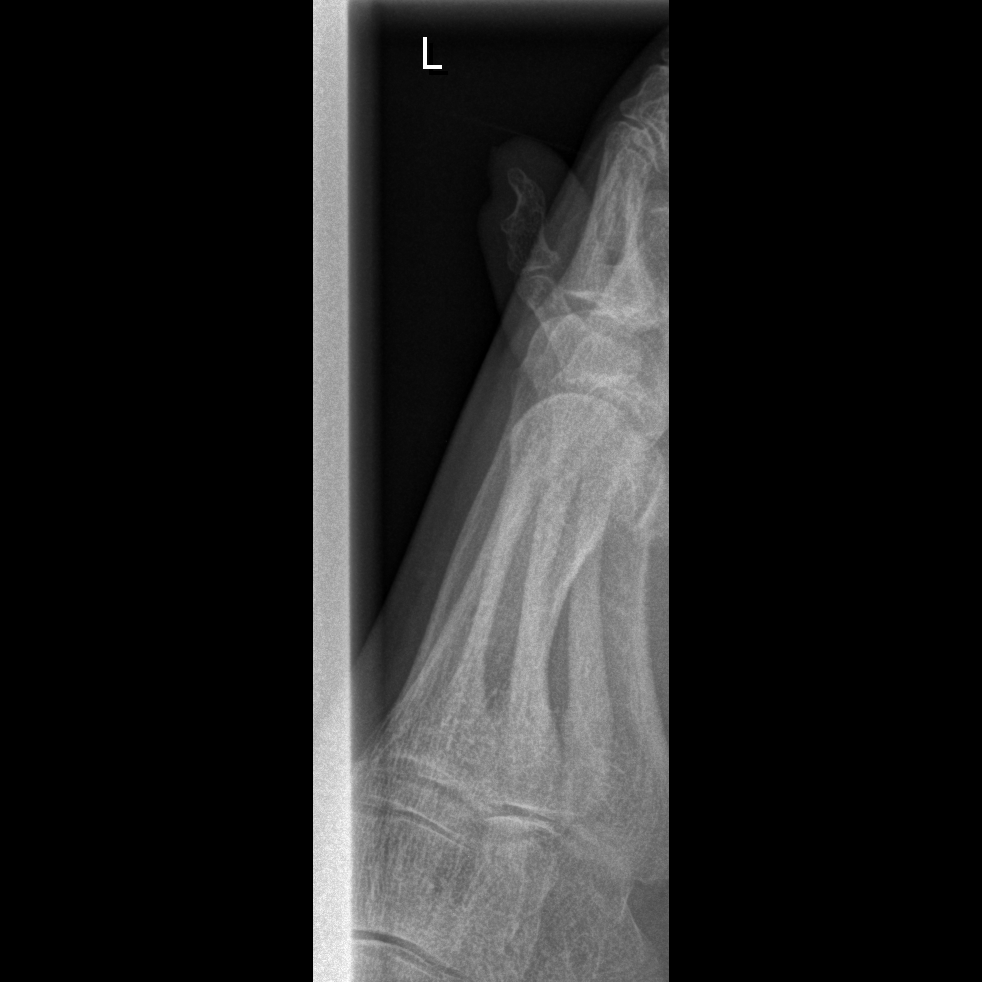

[3 of 3 positions shown; findings below may reference images not displayed]

FINDINGS: Osseous demineralization.
Joint spaces preserved.
Transverse fracture at distal aspect of proximal phalanx left fifth
toe, slightly displaced laterally.
No definite intra-articular extension at PIP joint.
No additional fracture or dislocation seen.
IMPRESSION: Nondisplaced fracture at distal aspect of proximal phalanx left
fifth toe.

## 2013-09-12 ENCOUNTER — Encounter: Payer: Self-pay | Admitting: Family Medicine

## 2013-10-24 ENCOUNTER — Encounter: Payer: Self-pay | Admitting: *Deleted

## 2013-11-27 ENCOUNTER — Encounter: Payer: Self-pay | Admitting: Physician Assistant

## 2013-11-27 ENCOUNTER — Ambulatory Visit (INDEPENDENT_AMBULATORY_CARE_PROVIDER_SITE_OTHER): Payer: Medicare Other | Admitting: Emergency Medicine

## 2013-11-27 VITALS — BP 110/66 | HR 55 | Temp 97.9°F | Resp 16 | Ht 62.5 in | Wt 138.2 lb

## 2013-11-27 DIAGNOSIS — IMO0001 Reserved for inherently not codable concepts without codable children: Secondary | ICD-10-CM

## 2013-11-27 DIAGNOSIS — G8929 Other chronic pain: Secondary | ICD-10-CM

## 2013-11-27 DIAGNOSIS — R9431 Abnormal electrocardiogram [ECG] [EKG]: Secondary | ICD-10-CM

## 2013-11-27 DIAGNOSIS — Z1159 Encounter for screening for other viral diseases: Secondary | ICD-10-CM

## 2013-11-27 DIAGNOSIS — R319 Hematuria, unspecified: Secondary | ICD-10-CM

## 2013-11-27 DIAGNOSIS — M797 Fibromyalgia: Secondary | ICD-10-CM

## 2013-11-27 DIAGNOSIS — Z0000 Encounter for general adult medical examination without abnormal findings: Secondary | ICD-10-CM

## 2013-11-27 DIAGNOSIS — N8111 Cystocele, midline: Secondary | ICD-10-CM

## 2013-11-27 DIAGNOSIS — Z1239 Encounter for other screening for malignant neoplasm of breast: Secondary | ICD-10-CM

## 2013-11-27 DIAGNOSIS — M8580 Other specified disorders of bone density and structure, unspecified site: Secondary | ICD-10-CM

## 2013-11-27 DIAGNOSIS — Z1211 Encounter for screening for malignant neoplasm of colon: Secondary | ICD-10-CM

## 2013-11-27 DIAGNOSIS — IMO0002 Reserved for concepts with insufficient information to code with codable children: Secondary | ICD-10-CM

## 2013-11-27 DIAGNOSIS — Z23 Encounter for immunization: Secondary | ICD-10-CM

## 2013-11-27 LAB — CBC WITH DIFFERENTIAL/PLATELET
Basophils Absolute: 0.1 10*3/uL (ref 0.0–0.1)
Basophils Relative: 1 % (ref 0–1)
MCHC: 35.2 g/dL (ref 30.0–36.0)
Monocytes Absolute: 0.5 10*3/uL (ref 0.1–1.0)
Neutro Abs: 2.8 10*3/uL (ref 1.7–7.7)
Neutrophils Relative %: 54 % (ref 43–77)
Platelets: 233 10*3/uL (ref 150–400)
RDW: 12.9 % (ref 11.5–15.5)

## 2013-11-27 LAB — COMPREHENSIVE METABOLIC PANEL
AST: 23 U/L (ref 0–37)
Albumin: 4.3 g/dL (ref 3.5–5.2)
Alkaline Phosphatase: 58 U/L (ref 39–117)
BUN: 13 mg/dL (ref 6–23)
Potassium: 5.1 mEq/L (ref 3.5–5.3)
Sodium: 143 mEq/L (ref 135–145)

## 2013-11-27 LAB — POCT UA - MICROSCOPIC ONLY
Casts, Ur, LPF, POC: NEGATIVE
Yeast, UA: NEGATIVE

## 2013-11-27 LAB — LIPID PANEL
HDL: 46 mg/dL (ref >39)
LDL Cholesterol: 97 mg/dL (ref 0–99)

## 2013-11-27 LAB — HEPATITIS C ANTIBODY: HCV Ab: NEGATIVE

## 2013-11-27 LAB — POCT URINALYSIS DIPSTICK
Glucose, UA: NEGATIVE
Nitrite, UA: NEGATIVE
Spec Grav, UA: >=1.03
Urobilinogen, UA: 0.2

## 2013-11-27 LAB — IFOBT (OCCULT BLOOD): IFOBT: NEGATIVE

## 2013-11-27 LAB — TSH: TSH: 1.676 u[IU]/mL (ref 0.350–4.500)

## 2013-11-27 NOTE — Progress Notes (Signed)
Subjective:    Patient ID: Alicia Bass, female    DOB: 13-Apr-1946, 67 y.o.   MRN: [id number]  HPI Presents for Annual Wellness Exam-Initial. Last CPE was 11/2012. Colonoscopy in 2006. No longer a candidate for pap testing.  Ovaries remain. DEXA 01/2009 revealed osteopenia. She is current with flu and shingles vaccines. Needs pneumococcal vaccine today.  Depression and fall risk screening performed-she is at low risk. Screening for hearing loss, and ADLs and end-of life planning performed.  Since her last visit, her husband has been diagnosed with early Parkinson's.  Active Ambulatory Problems    Diagnosis Date Noted  . Fibromyalgia 12/12/2012  . Hiatal hernia   . Bladder cystocele 12/12/2012  . Osteopenia 12/12/2012   Resolved Ambulatory Problems    Diagnosis Date Noted  . No Resolved Ambulatory Problems   Past Medical History  Diagnosis Date  . Basal cell carcinoma   . Shingles 2007  . Arthritis   . Allergy     Past Surgical History  Procedure Laterality Date  . Abdominal hysterectomy    . Gallbladder surgery      Allergies  Allergen Reactions  . Codeine     Prior to Admission medications   Medication Sig Start Date End Date Taking? Authorizing Provider  Ascorbic Acid (VITAMIN C PO) Take by mouth daily.   Yes Historical Provider, MD  aspirin 81 MG tablet Take 81 mg by mouth daily.   Yes Historical Provider, MD  BIOTIN PO Take by mouth daily.   Yes Historical Provider, MD  glucosamine-chondroitin 500-400 MG tablet Take 1 tablet by mouth 3 (three) times daily.   Yes Historical Provider, MD  Multiple Vitamin (MULTIVITAMIN) tablet Take 1 tablet by mouth daily.   Yes Historical Provider, MD  OVER THE COUNTER MEDICATION OTC B12 taking everyday   Yes Historical Provider, MD  OVER THE COUNTER MEDICATION OTC Ginge Root 550 mg taking everyday   Yes Historical Provider, MD  OVER THE COUNTER MEDICATION OTC Cinnamon 1000 mg taking everyday   Yes Historical Provider, MD   OVER THE COUNTER MEDICATION OTC Acid reflux med taking   Yes Historical Provider, MD  Cholecalciferol (VITAMIN D PO) Take by mouth daily.    Historical Provider, MD    History   Social History  . Marital Status: Married    Spouse Name: Neera Teng    Number of Children: 5  . Years of Education: 12   Occupational History  . hairdresser    Social History Main Topics  . Smoking status: Never Smoker   . Smokeless tobacco: Never Used  . Alcohol Use: Yes     Comment: 1-2 annually  . Drug Use: No  . Sexual Activity: Yes    Partners: Female    Birth Control/ Protection: Surgical   Other Topics Concern  . None   Social History Narrative   Lives with husband.  Square dances, bowls and rides motorcycles with her husband. Her daughter and 2 grandchildren live with them.  Her son died by suicide at age 53.  Her husband also has 3 children from a previous relationship.    family history includes Cancer in her father, maternal uncle, and sister; Diabetes in her sister; Early death in her son; Heart disease in her brother, mother, sister, and sister; Kidney disease in her paternal aunt; Mental illness (age of onset: 7) in her son; Stroke in her sister. indicated that her mother is alive. She indicated that her father is deceased. She indicated  that both of her sisters are alive. She indicated that her brother is alive. She indicated that her maternal grandmother is deceased. She indicated that her maternal grandfather is deceased. She indicated that her paternal grandmother is deceased. She indicated that her paternal grandfather is deceased. She indicated that her daughter is alive. She indicated that her son is deceased. She indicated that her maternal aunt is alive. She indicated that her maternal uncle is alive. She indicated that her paternal aunt is deceased. She indicated that her paternal uncle is alive.   Review of Systems  Constitutional: Negative.   HENT: Positive for sneezing.  Negative for congestion, dental problem, drooling, ear discharge, ear pain, facial swelling, hearing loss, mouth sores, nosebleeds, postnasal drip, rhinorrhea, sinus pressure, sore throat, tinnitus, trouble swallowing and voice change.   Eyes: Negative.   Respiratory: Negative.   Cardiovascular: Negative.   Gastrointestinal: Negative.   Endocrine: Negative.   Genitourinary: Negative.   Musculoskeletal: Positive for arthralgias (LEFT elbow after bumping it several months ago.  Has stopped bowling and pain is improving). Negative for back pain, gait problem, joint swelling, myalgias, neck pain and neck stiffness.  Skin: Negative.   Allergic/Immunologic: Positive for environmental allergies. Negative for food allergies and immunocompromised state.  Neurological: Negative.   Hematological: Negative.   Psychiatric/Behavioral: Negative.        Objective:   Physical Exam  Vitals reviewed. Constitutional: She is oriented to person, place, and time. Vital signs are normal. She appears well-developed and well-nourished. She is active and cooperative. No distress.  HENT:  Head: Normocephalic and atraumatic.  Right Ear: Hearing, tympanic membrane, external ear and ear canal normal. No foreign bodies.  Left Ear: Hearing, tympanic membrane, external ear and ear canal normal. No foreign bodies.  Nose: Nose normal.  Mouth/Throat: Uvula is midline, oropharynx is clear and moist and mucous membranes are normal. No oral lesions. Normal dentition. No dental abscesses or uvula swelling. No oropharyngeal exudate.  Eyes: Conjunctivae, EOM and lids are normal. Pupils are equal, round, and reactive to light. Right eye exhibits no discharge. Left eye exhibits no discharge. No scleral icterus.  Fundoscopic exam:      The right eye shows no arteriolar narrowing, no AV nicking, no exudate, no hemorrhage and no papilledema. The right eye shows red reflex.       The left eye shows no arteriolar narrowing, no AV  nicking, no exudate, no hemorrhage and no papilledema. The left eye shows red reflex.  Neck: Trachea normal, normal range of motion and full passive range of motion without pain. Neck supple. No spinous process tenderness and no muscular tenderness present. No mass and no thyromegaly present.  Cardiovascular: Normal rate, regular rhythm, normal heart sounds, intact distal pulses and normal pulses.   Pulmonary/Chest: Effort normal and breath sounds normal. Right breast exhibits no inverted nipple, no mass, no nipple discharge, no skin change and no tenderness. Left breast exhibits no inverted nipple, no mass, no nipple discharge, no skin change and no tenderness. Breasts are symmetrical.  Genitourinary: Rectum normal. Rectal exam shows no external hemorrhoid, no internal hemorrhoid, no fissure, no mass, no tenderness and anal tone normal. Guaiac negative stool.  Musculoskeletal: She exhibits no edema and no tenderness.       Cervical back: Normal.       Thoracic back: Normal.       Lumbar back: Normal.  Lymphadenopathy:       Head (right side): No tonsillar, no preauricular, no posterior auricular and  no occipital adenopathy present.       Head (left side): No tonsillar, no preauricular, no posterior auricular and no occipital adenopathy present.    She has no cervical adenopathy.       Right: No supraclavicular adenopathy present.       Left: No supraclavicular adenopathy present.  Neurological: She is alert and oriented to person, place, and time. She has normal strength and normal reflexes. No cranial nerve deficit. She exhibits normal muscle tone. Coordination and gait normal.  Skin: Skin is warm, dry and intact. No rash noted. She is not diaphoretic. No cyanosis or erythema. Nails show no clubbing.  Psychiatric: She has a normal mood and affect. Her speech is normal and behavior is normal. Judgment and thought content normal.      Results for orders placed in visit on 11/27/13  IFOBT  (OCCULT BLOOD)      Result Value Range   IFOBT Negative    POCT UA - MICROSCOPIC ONLY      Result Value Range   WBC, Ur, HPF, POC 1-2     RBC, urine, microscopic 2-3     Bacteria, U Microscopic 2+     Mucus, UA pos     Epithelial cells, urine per micros 1-2     Crystals, Ur, HPF, POC neg     Casts, Ur, LPF, POC neg     Yeast, UA neg    POCT URINALYSIS DIPSTICK      Result Value Range   Color, UA yellow     Clarity, UA clear     Glucose, UA neg     Bilirubin, UA neg     Ketones, UA neg     Spec Grav, UA >=1.030     Blood, UA neg     pH, UA 5.5     Protein, UA neg     Urobilinogen, UA 0.2     Nitrite, UA neg     Leukocytes, UA Trace     EKG today reveals T-wave depression in the lateral leads, which is a change from previous EKGs in 2006 and 2009. Reviewed with Dr. Cleta Alberts.    Assessment & Plan:  Routine general medical examination at a health care facility - Plan: CBC with Differential, Comprehensive metabolic panel, Lipid panel, TSH, POCT UA - Microscopic Only, POCT urinalysis dipstick, EKG 12-Lead  Fibromyalgia - stable.  Bladder cystocele - stable.  Osteopenia - last DEXA in 2010. Schedule update.  Screening for colon cancer - Plan: IFOBT POC (occult bld, rslt in office). Colonoscopy due in 2016.  Screening for breast cancer - Plan: MM Digital Screening  Elbow pain, chronic, left - continue with rest for now, but may need additional evaluation/treatment if persists when resumes bowling league.  Need for hepatitis C screening test - Plan: Hepatitis C antibody  Need for pneumococcal vaccine - Plan: Pneumococcal polysaccharide vaccine 23-valent greater than or equal to 2yo subcutaneous/IM  Abnormal EKG - Plan: Ambulatory referral to Cardiology  Hematuria, microscopic - Plan: Ambulatory referral to Urology  Fernande Bras, PA-C Physician Assistant-Certified [facility]

## 2013-11-27 NOTE — Patient Instructions (Signed)
I will contact you with your lab results as soon as they are available.   If you have not heard from me in 2 weeks, please contact me.  The fastest way to get your results is to register for My Chart (see the instructions on the last page of this printout).  Keeping You Healthy  Get These Tests  Blood Pressure- Have your blood pressure checked by your healthcare provider at least once a year.  Normal blood pressure is 120/80.  Weight- Have your body mass index (BMI) calculated to screen for obesity.  BMI is a measure of body fat based on height and weight.  You can calculate your own BMI at www.nhlbisupport.com/bmi/  Cholesterol- Have your cholesterol checked every year.  Diabetes- Have your blood sugar checked every year if you have high blood pressure, high cholesterol, a family history of diabetes or if you are overweight.  Pap Smear- Have a pap smear every 1 to 3 years if you have been sexually active.  If you are older than 65 and recent pap smears have been normal you may not need additional pap smears.  In addition, if you have had a hysterectomy  For benign disease additional pap smears are not necessary.  Mammogram-Yearly mammograms are essential for early detection of breast cancer  Screening for Colon Cancer- Colonoscopy starting at age 50. Screening may begin sooner depending on your family history and other health conditions.  Follow up colonoscopy as directed by your Gastroenterologist.  Screening for Osteoporosis- Screening begins at age 65 with bone density scanning, sooner if you are at higher risk for developing Osteoporosis.  Get these medicines  Calcium with Vitamin D- Your body requires 1200-1500 mg of Calcium a day and 800-1000 IU of Vitamin D a day.  You can only absorb 500 mg of Calcium at a time therefore Calcium must be taken in 2 or 3 separate doses throughout the day.  Hormones- Hormone therapy has been associated with increased risk for certain cancers and  heart disease.  Talk to your healthcare provider about if you need relief from menopausal symptoms.  Aspirin- Ask your healthcare provider about taking Aspirin to prevent Heart Disease and Stroke.  Get these Immuniztions  Flu shot- Every fall  Pneumonia shot- Once after the age of 65; if you are younger ask your healthcare provider if you need a pneumonia shot.  Tetanus- Every ten years.  Zostavax- Once after the age of 60 to prevent shingles.  Take these steps  Don't smoke- Your healthcare provider can help you quit. For tips on how to quit, ask your healthcare provider or go to www.smokefree.gov or call 1-800 QUIT-NOW.  Be physically active- Exercise 5 days a week for a minimum of 30 minutes.  If you are not already physically active, start slow and gradually work up to 30 minutes of moderate physical activity.  Try walking, dancing, bike riding, swimming, etc.  Eat a healthy diet- Eat a variety of healthy foods such as fruits, vegetables, whole grains, low fat milk, low fat cheeses, yogurt, lean meats, chicken, fish, eggs, dried beans, tofu, etc.  For more information go to www.thenutritionsource.org  Dental visit- Brush and floss teeth twice daily; visit your dentist twice a year.  Eye exam- Visit your Optometrist or Ophthalmologist yearly.  Drink alcohol in moderation- Limit alcohol intake to one drink or less a day.  Never drink and drive.  Depression- Your emotional health is as important as your physical health.  If you're feeling   down or losing interest in things you normally enjoy, please talk to your healthcare provider.  Seat Belts- can save your life; always wear one  Smoke/Carbon Monoxide detectors- These detectors need to be installed on the appropriate level of your home.  Replace batteries at least once a year.  Violence- If anyone is threatening or hurting you, please tell your healthcare provider.  Living Will/ Health care power of attorney- Discuss with your  healthcare provider and family. 

## 2013-11-27 NOTE — Progress Notes (Deleted)
   Subjective:    Patient ID: Alicia Bass, female    DOB: 1946/06/20, 67 y.o.   MRN: [id number]  HPI    Review of Systems  Constitutional: Negative.   HENT: Positive for sneezing.   Musculoskeletal: Positive for neck pain.  Allergic/Immunologic: Positive for environmental allergies.       Objective:   Physical Exam        Assessment & Plan:

## 2013-11-28 ENCOUNTER — Encounter: Payer: Self-pay | Admitting: Emergency Medicine

## 2013-12-17 ENCOUNTER — Encounter: Payer: Self-pay | Admitting: Physician Assistant

## 2013-12-17 DIAGNOSIS — R3129 Other microscopic hematuria: Secondary | ICD-10-CM

## 2013-12-17 DIAGNOSIS — IMO0002 Reserved for concepts with insufficient information to code with codable children: Secondary | ICD-10-CM

## 2014-01-08 ENCOUNTER — Ambulatory Visit (INDEPENDENT_AMBULATORY_CARE_PROVIDER_SITE_OTHER): Payer: Medicare Other | Admitting: Cardiovascular Disease

## 2014-01-08 ENCOUNTER — Encounter: Payer: Self-pay | Admitting: Cardiovascular Disease

## 2014-01-08 VITALS — BP 120/70 | HR 69 | Resp 16 | Ht 62.0 in | Wt 140.0 lb

## 2014-01-08 DIAGNOSIS — R9431 Abnormal electrocardiogram [ECG] [EKG]: Secondary | ICD-10-CM | POA: Insufficient documentation

## 2014-01-08 DIAGNOSIS — R0602 Shortness of breath: Secondary | ICD-10-CM

## 2014-01-08 NOTE — Progress Notes (Signed)
Patient ID: Alicia Bass, female   DOB: 1946-03-24, 68 y.o.   MRN: [id number]      Reason for office visit Abnormal electrocardiogram  Mrs. Fronczak is referred for an abnormal resting electrocardiogram. Was performed during a "routine physical". I'm not sure she has had older electrocardiograms. I do have the echocardiogram from December 2 available for comparison with an ECG that was performed today. They both showed nonspecific ST-T wave changes that are seen primarily in leads 22F and V5 through V6. There is no dynamic progression of these changes between the 2 tracings. She also has a minor intraventricular conduction delay with a crochet on the downslope of the QRS complex in leads V4 through V6 as well as the inferior leads. The overall QRS duration is borderline at 94 ms. QT interval is normal.  She has never had problems with chest discomfort either at rest or with exertion. She exercises 2-3 days a week by square dancing and occasionally becomes short of breath doing these exercises. She denies dizziness syncope. Has noticed occasional palpitations when she lies in bed at night. These are never sustained. There is a very strong family history of coronary disease in many of her family members are patients in our clinic. She is concerned that she may have a similar problem.   Allergies  Allergen Reactions  . Codeine     Current Outpatient Prescriptions  Medication Sig Dispense Refill  . Ascorbic Acid (VITAMIN C PO) Take by mouth daily.      Marland Kitchen aspirin 81 MG tablet Take 81 mg by mouth daily.      Marland Kitchen BIOTIN PO Take by mouth daily.      . Cholecalciferol (VITAMIN D PO) Take by mouth daily.      Marland Kitchen glucosamine-chondroitin 500-400 MG tablet Take 1 tablet by mouth 3 (three) times daily.      . Multiple Vitamin (MULTIVITAMIN) tablet Take 1 tablet by mouth daily.      Marland Kitchen OVER THE COUNTER MEDICATION OTC B12 taking everyday      . OVER THE COUNTER MEDICATION OTC Ginge Root 550 mg taking everyday       . OVER THE COUNTER MEDICATION OTC Cinnamon 1000 mg taking everyday      . OVER THE COUNTER MEDICATION OTC Acid reflux med taking       No current facility-administered medications for this visit.    Past Medical History  Diagnosis Date  . Fibromyalgia   . Basal cell carcinoma     LEFT anterior chest wall  . Shingles 2007  . Hiatal hernia   . Arthritis   . Allergy     Past Surgical History  Procedure Laterality Date  . Abdominal hysterectomy    . Gallbladder surgery      Family History  Problem Relation Age of Onset  . Heart disease Mother   . Cancer Father   . Cancer Sister     "full blown cancer," "it started as a squeamish on her head"  . Heart disease Sister   . Diabetes Sister   . Heart disease Brother   . Early death Son   . Mental illness Son 39    bipolar disorder  . Cancer Maternal Uncle   . Kidney disease Paternal Aunt   . Stroke Sister   . Heart disease Sister     History   Social History  . Marital Status: Married    Spouse Name: Abbigale Mcelhaney    Number of Children: 5  .  Years of Education: 12   Occupational History  . hairdresser    Social History Main Topics  . Smoking status: Never Smoker   . Smokeless tobacco: Never Used  . Alcohol Use: Yes     Comment: 1-2 annually  . Drug Use: No  . Sexual Activity: Yes    Partners: Female    Birth Control/ Protection: Surgical   Other Topics Concern  . Not on file   Social History Narrative   Lives with husband.  Square dances, bowls and rides motorcycles with her husband. Her daughter and 2 grandchildren live with them.  Her son died by suicide at age 62.  Her husband also has 3 children from a previous relationship.    Review of systems: The patient specifically denies any chest pain at rest or with exertion, dyspnea at rest , orthopnea, paroxysmal nocturnal dyspnea, syncope, palpitations, focal neurological deficits, intermittent claudication, lower extremity edema, unexplained weight  gain, cough, hemoptysis or wheezing.  The patient also denies abdominal pain, nausea, vomiting, dysphagia, diarrhea, constipation, polyuria, polydipsia, dysuria, hematuria, frequency, urgency, abnormal bleeding or bruising, fever, chills, unexpected weight changes, mood swings, change in skin or hair texture, change in voice quality, auditory or visual problems, allergic reactions or rashes, new musculoskeletal complaints other than usual "aches and pains".   PHYSICAL EXAM BP 120/70  Pulse 69  Resp 16  Ht 5\' 2"  (1.575 m)  Wt 140 lb (63.504 kg)  BMI 25.60 kg/m2  General: Alert, oriented x3, no distress Head: no evidence of trauma, PERRL, EOMI, no exophtalmos or lid lag, no myxedema, no xanthelasma; normal ears, nose and oropharynx Neck: normal jugular venous pulsations and no hepatojugular reflux; brisk carotid pulses without delay and no carotid bruits Chest: clear to auscultation, no signs of consolidation by percussion or palpation, normal fremitus, symmetrical and full respiratory excursions Cardiovascular: normal position and quality of the apical impulse, regular rhythm, normal first and second heart sounds, no murmurs, rubs or gallops Abdomen: no tenderness or distention, no masses by palpation, no abnormal pulsatility or arterial bruits, normal bowel sounds, no hepatosplenomegaly Extremities: no clubbing, cyanosis or edema; 2+ radial, ulnar and brachial pulses bilaterally; 2+ right femoral, posterior tibial and dorsalis pedis pulses; 2+ left femoral, posterior tibial and dorsalis pedis pulses; no subclavian or femoral bruits Neurological: grossly nonfocal   EKG: Sinus rhythm, minor IVCD, nonspecific ST-T wave changes, described in detail above  Lipid Panel     Component Value Date/Time   CHOL 161 11/27/2013 1051   TRIG 90 11/27/2013 1051   HDL 46 11/27/2013 1051   CHOLHDL 3.5 11/27/2013 1051   VLDL 18 11/27/2013 1051   LDLCALC 97 11/27/2013 1051    BMET    Component Value  Date/Time   NA 143 11/27/2013 1051   K 5.1 11/27/2013 1051   CL 108 11/27/2013 1051   CO2 26 11/27/2013 1051   GLUCOSE 89 11/27/2013 1051   BUN 13 11/27/2013 1051   CREATININE 0.78 11/27/2013 1051   CALCIUM 9.4 11/27/2013 1051     ASSESSMENT AND PLAN Mrs. Clelland electrocardiographic changes are nonspecific and they probably are related to intraventricular conduction delay rather than ischemic heart disease. I would recommend an echocardiogram to make sure there is no evidence of cardiomyopathy, but if this is normal I don't think further workup is justified. At most a simple treadmill ECG stress test might be useful, but interpreting this may be challenging with the baseline ECG abnormalities. I think we'll just start with an echocardiogram  for the time being. I see no reason to restrict her activity at this time. If the echo is normal and she continues to feel well she will followup on an as-needed basis only.  No orders of the defined types were placed in this encounter.   No orders of the defined types were placed in this encounter.    Holli Humbles, MD, [place] [phone] office [phone] pager

## 2014-01-08 NOTE — Patient Instructions (Signed)
Your physician has requested that you have an echocardiogram. Echocardiography is a painless test that uses sound waves to create images of your heart. It provides your doctor with information about the size and shape of your heart and how well your heart's chambers and valves are working. This procedure takes approximately one hour. There are no restrictions for this procedure.  Your physician recommends that you schedule a follow-up appointment as needed.  

## 2014-01-09 ENCOUNTER — Encounter: Payer: Self-pay | Admitting: Cardiovascular Disease

## 2014-01-14 ENCOUNTER — Ambulatory Visit
Admission: RE | Admit: 2014-01-14 | Discharge: 2014-01-14 | Disposition: A | Discharge status: Home / Self Care | Payer: Medicare Other | Source: Ambulatory Visit | Attending: Physician Assistant | Admitting: Physician Assistant

## 2014-01-14 DIAGNOSIS — Z1239 Encounter for other screening for malignant neoplasm of breast: Secondary | ICD-10-CM

## 2014-01-14 DIAGNOSIS — M8580 Other specified disorders of bone density and structure, unspecified site: Secondary | ICD-10-CM

## 2014-01-15 ENCOUNTER — Other Ambulatory Visit: Payer: Self-pay | Admitting: Physician Assistant

## 2014-01-15 ENCOUNTER — Encounter: Payer: Self-pay | Admitting: Physician Assistant

## 2014-01-15 DIAGNOSIS — M810 Age-related osteoporosis without current pathological fracture: Secondary | ICD-10-CM

## 2014-01-15 MED ORDER — ALENDRONATE SODIUM 70 MG PO TABS: 70.0000 mg | ORAL_TABLET | ORAL | Status: DC

## 2014-01-28 ENCOUNTER — Ambulatory Visit (HOSPITAL_COMMUNITY)
Admission: RE | Admit: 2014-01-28 | Discharge: 2014-01-28 | Disposition: A | Discharge status: Home / Self Care | Payer: Medicare Other | Source: Ambulatory Visit | Attending: Cardiovascular Disease | Admitting: Cardiovascular Disease

## 2014-01-28 DIAGNOSIS — I079 Rheumatic tricuspid valve disease, unspecified: Secondary | ICD-10-CM | POA: Insufficient documentation

## 2014-01-28 DIAGNOSIS — Z8249 Family history of ischemic heart disease and other diseases of the circulatory system: Secondary | ICD-10-CM | POA: Insufficient documentation

## 2014-01-28 DIAGNOSIS — I379 Nonrheumatic pulmonary valve disorder, unspecified: Secondary | ICD-10-CM | POA: Insufficient documentation

## 2014-01-28 DIAGNOSIS — R0602 Shortness of breath: Secondary | ICD-10-CM

## 2014-01-28 DIAGNOSIS — I059 Rheumatic mitral valve disease, unspecified: Secondary | ICD-10-CM | POA: Insufficient documentation

## 2014-01-28 DIAGNOSIS — I369 Nonrheumatic tricuspid valve disorder, unspecified: Secondary | ICD-10-CM

## 2014-01-28 DIAGNOSIS — R0989 Other specified symptoms and signs involving the circulatory and respiratory systems: Secondary | ICD-10-CM | POA: Insufficient documentation

## 2014-01-28 DIAGNOSIS — R0609 Other forms of dyspnea: Secondary | ICD-10-CM | POA: Insufficient documentation

## 2014-01-28 DIAGNOSIS — R002 Palpitations: Secondary | ICD-10-CM | POA: Insufficient documentation

## 2014-01-28 NOTE — Progress Notes (Signed)
2D Echo Performed 01/28/2014    Keyanna Sandefer, RCS  

## 2014-07-16 ENCOUNTER — Other Ambulatory Visit: Payer: Self-pay | Admitting: Dermatology

## 2014-08-17 ENCOUNTER — Ambulatory Visit (INDEPENDENT_AMBULATORY_CARE_PROVIDER_SITE_OTHER): Payer: Medicare Other | Admitting: Family Medicine

## 2014-08-17 ENCOUNTER — Encounter: Payer: Self-pay | Admitting: Family Medicine

## 2014-08-17 VITALS — BP 136/88 | HR 78 | Temp 97.9°F | Resp 18

## 2014-08-17 DIAGNOSIS — S61209A Unspecified open wound of unspecified finger without damage to nail, initial encounter: Secondary | ICD-10-CM

## 2014-08-17 DIAGNOSIS — M79644 Pain in right finger(s): Secondary | ICD-10-CM

## 2014-08-17 DIAGNOSIS — M79609 Pain in unspecified limb: Secondary | ICD-10-CM

## 2014-08-17 NOTE — Progress Notes (Signed)
Subjective: Patient was sweeping cobwebs and things from her porch with a metal broom. The handle had a crack in it and broke and cut her right ring finger Her tetanus immunization is up-to-date, 2011  Objective: Wound right ring finger, over 3 CM. Neurovascular appears to be intact and strength of distal phalanx seems good.  Assessment: Wound ring finger  Plan: Will require suture repair

## 2014-08-17 NOTE — Patient Instructions (Signed)

## 2014-08-17 NOTE — Progress Notes (Signed)
Verbal consent obtained from patient.  Local anesthesia with 3cc Lidocaine 2% without epinephrine.  Wound scrubbed with soap and water and rinsed.  Wound closed with #11 5-0 Prolene simple interrupted sutures.  Wound cleansed and dressed.

## 2014-08-25 ENCOUNTER — Ambulatory Visit (INDEPENDENT_AMBULATORY_CARE_PROVIDER_SITE_OTHER): Payer: Medicare Other | Admitting: Physician Assistant

## 2014-08-25 VITALS — BP 140/80 | HR 74 | Temp 97.8°F | Resp 18 | Ht 62.75 in | Wt 140.2 lb

## 2014-08-25 DIAGNOSIS — Z5189 Encounter for other specified aftercare: Secondary | ICD-10-CM

## 2014-08-25 DIAGNOSIS — S61209A Unspecified open wound of unspecified finger without damage to nail, initial encounter: Secondary | ICD-10-CM

## 2014-08-25 DIAGNOSIS — S61219D Laceration without foreign body of unspecified finger without damage to nail, subsequent encounter: Secondary | ICD-10-CM

## 2014-08-25 NOTE — Progress Notes (Signed)
   Subjective:    Patient ID: Alicia Bass, female    DOB: July 07, 1946, 68 y.o.   MRN: [id number]  HPI  Pt here fore suture removal. The area is sore but has not stopped her from doing anything.  Review of Systems     Objective:   Physical Exam  Vitals reviewed. Constitutional: She appears well-developed and well-nourished.  Pulmonary/Chest: Effort normal.  Neurological: She is alert.  Skin: Skin is warm and dry.  Stitches removed from a well healed wound.  Psychiatric: She has a normal mood and affect. Her behavior is normal. Judgment and thought content normal.       Assessment & Plan:  Wound finger - healed   Windell Hummingbird PA-C  [facility] 08/25/2014 11:18 AM

## 2014-12-06 ENCOUNTER — Telehealth: Payer: Self-pay

## 2014-12-06 NOTE — Telephone Encounter (Signed)
LMVM for pt to get their flu shot.

## 2014-12-22 ENCOUNTER — Ambulatory Visit (INDEPENDENT_AMBULATORY_CARE_PROVIDER_SITE_OTHER): Payer: Medicare Other | Admitting: Physician Assistant

## 2014-12-22 VITALS — BP 124/80 | HR 73 | Temp 98.0°F | Resp 17 | Ht 63.0 in | Wt 140.0 lb

## 2014-12-22 DIAGNOSIS — Z0000 Encounter for general adult medical examination without abnormal findings: Secondary | ICD-10-CM

## 2014-12-22 DIAGNOSIS — H11009 Unspecified pterygium of unspecified eye: Secondary | ICD-10-CM | POA: Insufficient documentation

## 2014-12-22 DIAGNOSIS — Z6824 Body mass index (BMI) 24.0-24.9, adult: Secondary | ICD-10-CM

## 2014-12-22 DIAGNOSIS — Z131 Encounter for screening for diabetes mellitus: Secondary | ICD-10-CM

## 2014-12-22 DIAGNOSIS — R319 Hematuria, unspecified: Secondary | ICD-10-CM

## 2014-12-22 DIAGNOSIS — H11002 Unspecified pterygium of left eye: Secondary | ICD-10-CM

## 2014-12-22 DIAGNOSIS — Z130 Encounter for screening for diseases of the blood and blood-forming organs and certain disorders involving the immune mechanism: Secondary | ICD-10-CM

## 2014-12-22 LAB — POCT UA - MICROSCOPIC ONLY
CRYSTALS, UR, HPF, POC: NEGATIVE
Casts, Ur, LPF, POC: NEGATIVE
Mucus, UA: NEGATIVE
RBC, URINE, MICROSCOPIC: NEGATIVE
YEAST UA: NEGATIVE

## 2014-12-22 LAB — POCT CBC
Granulocyte percent: 62.4 %G (ref 37–80)
HCT, POC: 44.2 % (ref 37.7–47.9)
Hemoglobin: 14.2 g/dL (ref 12.2–16.2)
Lymph, poc: 1.6 (ref 0.6–3.4)
MCH, POC: 29.8 pg (ref 27–31.2)
MCHC: 32.2 g/dL (ref 31.8–35.4)
MCV: 92.4 fL (ref 80–97)
MID (CBC): 0.4 (ref 0–0.9)
MPV: 7.3 fL (ref 0–99.8)
POC GRANULOCYTE: 3.4 (ref 2–6.9)
POC LYMPH PERCENT: 29.7 %L (ref 10–50)
POC MID %: 7.9 % (ref 0–12)
Platelet Count, POC: 227 10*3/uL (ref 142–424)
RBC: 4.78 M/uL (ref 4.04–5.48)
RDW, POC: 13.2 %
WBC: 5.4 10*3/uL (ref 4.6–10.2)

## 2014-12-22 LAB — POCT URINALYSIS DIPSTICK
BILIRUBIN UA: NEGATIVE
GLUCOSE UA: NEGATIVE
Ketones, UA: NEGATIVE
Nitrite, UA: NEGATIVE
Protein, UA: NEGATIVE
RBC UA: NEGATIVE
SPEC GRAV UA: 1.01
Urobilinogen, UA: 0.2
pH, UA: 7

## 2014-12-22 LAB — COMPREHENSIVE METABOLIC PANEL
ALK PHOS: 84 U/L (ref 39–117)
ALT: 59 U/L — ABNORMAL HIGH (ref 0–35)
AST: 44 U/L — AB (ref 0–37)
Albumin: 4.5 g/dL (ref 3.5–5.2)
BILIRUBIN TOTAL: 0.5 mg/dL (ref 0.2–1.2)
BUN: 14 mg/dL (ref 6–23)
CO2: 26 mEq/L (ref 19–32)
Calcium: 9.1 mg/dL (ref 8.4–10.5)
Chloride: 106 mEq/L (ref 96–112)
Creat: 0.78 mg/dL (ref 0.50–1.10)
GLUCOSE: 99 mg/dL (ref 70–99)
POTASSIUM: 4.9 meq/L (ref 3.5–5.3)
Sodium: 141 mEq/L (ref 135–145)
Total Protein: 6.5 g/dL (ref 6.0–8.3)

## 2014-12-22 NOTE — Progress Notes (Signed)
Subjective:    Patient ID: Alicia Bass, female    DOB: [birth year], 68 y.o.   MRN: [id number]  Chief Complaint  Patient presents with  . Annual Exam    PCP: Jenny Reichmann, MD  Active Ambulatory Problems    Diagnosis Date Noted  . Fibromyalgia 12/12/2012  . Hiatal hernia   . Bladder cystocele 12/12/2012  . Osteoporosis 12/12/2012  . Microscopic hematuria 12/17/2013  . Abnormal resting ECG findings 01/08/2014   Resolved Ambulatory Problems    Diagnosis Date Noted  . No Resolved Ambulatory Problems   Past Medical History  Diagnosis Date  . Basal cell carcinoma   . Shingles 2007  . Arthritis   . Allergy     Past Surgical History  Procedure Laterality Date  . Abdominal hysterectomy    . Cholecystectomy    . Excision      BCC, back    Allergies  Allergen Reactions  . Asa [Aspirin] Other (See Comments)    "a whole big aspirin sets off my hiatal hernia." Tolerates low-dose.  . Codeine   . Midol [Aspirin-Cinnamedrine-Caffeine] Palpitations    Prior to Admission medications   Medication Sig Start Date End Date Taking? Authorizing Provider  Ascorbic Acid (VITAMIN C PO) Take by mouth daily.   Yes Historical Provider, MD  aspirin 81 MG tablet Take 81 mg by mouth daily.   Yes Historical Provider, MD  BIOTIN PO Take by mouth daily.   Yes Historical Provider, MD  glucosamine-chondroitin 500-400 MG tablet Take 1 tablet by mouth 3 (three) times daily.   Yes Historical Provider, MD  Multiple Vitamin (MULTIVITAMIN) tablet Take 1 tablet by mouth daily.   Yes Historical Provider, MD  OVER THE COUNTER MEDICATION OTC B12 taking everyday   Yes Historical Provider, MD  OVER THE COUNTER MEDICATION OTC Ginge Root 550 mg taking everyday   Yes Historical Provider, MD  OVER THE COUNTER MEDICATION OTC Cinnamon 1000 mg taking everyday   Yes Historical Provider, MD  Cholecalciferol (VITAMIN D PO) Take by mouth daily.    Historical Provider, MD  OVER THE COUNTER MEDICATION OTC Acid reflux  med taking    Historical Provider, MD    History   Social History  . Marital Status: Married    Spouse Name: Bernarda Erck    Number of Children: 5  . Years of Education: 12   Occupational History  . hairdresser    Social History Main Topics  . Smoking status: Never Smoker   . Smokeless tobacco: Never Used  . Alcohol Use: Yes     Comment: 1-2 annually  . Drug Use: No  . Sexual Activity:    Partners: Female    Patent examiner Protection: Surgical   Other Topics Concern  . None   Social History Narrative   Lives with husband who was diagnosed with Parkinson's in 2014.  Square dances, bowls and rides motorcycles with her husband. Her daughter and 2 grandchildren live with them.  Her son died by suicide at age 28.  Her husband also has 3 children from a previous relationship.    Family History  Problem Relation Age of Onset  . Heart disease Mother   . Cancer Father   . Cancer Sister     "full blown cancer," "it started as a squeamish on her head"  . Heart disease Sister   . Diabetes Sister   . Heart disease Brother   . Early death Son   . Mental illness Son  18    bipolar disorder  . Cancer Maternal Uncle   . Kidney disease Paternal Aunt   . Stroke Sister   . Heart disease Sister    indicated that her mother is alive. She indicated that her father is deceased. She indicated that both of her sisters are alive. She indicated that her brother is alive. She indicated that her maternal grandmother is deceased. She indicated that her maternal grandfather is deceased. She indicated that her paternal grandmother is deceased. She indicated that her paternal grandfather is deceased. She indicated that her daughter is alive. She indicated that her son is deceased. She indicated that her maternal aunt is alive. She indicated that her maternal uncle is alive. She indicated that her paternal aunt is deceased. She indicated that her paternal uncle is alive.   HPI  Presents for Annual  Exam. No pap needed due to hysterectomy for non-cancerous reason. Colonoscopy in 2013. Mammogram is current.  Review of Systems  Constitutional: Negative.   HENT: Positive for postnasal drip and rhinorrhea.        "This warm weather is acting my allergies up."  Eyes: Negative.   Respiratory: Negative.   Cardiovascular: Negative.   Gastrointestinal: Negative.   Endocrine: Negative.   Genitourinary: Negative.   Musculoskeletal: Positive for arthralgias (LEFT thumb "catches" and is tender intermittently). Negative for myalgias, back pain, joint swelling, gait problem, neck pain and neck stiffness.  Skin: Negative.   Allergic/Immunologic: Positive for environmental allergies and food allergies (pecans).  Neurological: Negative.   Hematological: Negative.   Psychiatric/Behavioral: Negative.        Objective:   Physical Exam  Constitutional: She is oriented to person, place, and time. Vital signs are normal. She appears well-developed and well-nourished. She is active and cooperative. No distress.  BP 124/80 mmHg  Pulse 73  Temp(Src) 98 F (36.7 C) (Oral)  Resp 17  Ht 5\' 3"  (1.6 m)  Wt 140 lb (63.504 kg)  BMI 24.81 kg/m2  SpO2 99%   HENT:  Head: Normocephalic and atraumatic.  Right Ear: Hearing, tympanic membrane, external ear and ear canal normal. No foreign bodies.  Left Ear: Hearing, tympanic membrane, external ear and ear canal normal. No foreign bodies.  Nose: Nose normal.  Mouth/Throat: Uvula is midline, oropharynx is clear and moist and mucous membranes are normal. No oral lesions. Normal dentition. No dental abscesses or uvula swelling. No oropharyngeal exudate.  Eyes: Conjunctivae, EOM and lids are normal. Pupils are equal, round, and reactive to light. Right eye exhibits no discharge. Left eye exhibits no discharge. No scleral icterus.  Fundoscopic exam:      The right eye shows no arteriolar narrowing, no AV nicking, no exudate, no hemorrhage and no papilledema. The  right eye shows red reflex.       The left eye shows no arteriolar narrowing, no AV nicking, no exudate, no hemorrhage and no papilledema. The left eye shows red reflex.    Neck: Trachea normal, normal range of motion and full passive range of motion without pain. Neck supple. No spinous process tenderness and no muscular tenderness present. No thyroid mass and no thyromegaly present.  Cardiovascular: Normal rate, regular rhythm, normal heart sounds, intact distal pulses and normal pulses.   Pulmonary/Chest: Effort normal and breath sounds normal. Right breast exhibits no inverted nipple, no mass, no nipple discharge, no skin change and no tenderness. Left breast exhibits no inverted nipple, no mass, no nipple discharge, no skin change and no tenderness. Breasts are  symmetrical.  Musculoskeletal: She exhibits no edema or tenderness.       Cervical back: Normal.       Thoracic back: Normal.       Lumbar back: Normal.       Left hand: Normal sensation noted. Normal strength noted.       Hands: Lymphadenopathy:       Head (right side): No tonsillar, no preauricular, no posterior auricular and no occipital adenopathy present.       Head (left side): No tonsillar, no preauricular, no posterior auricular and no occipital adenopathy present.    She has no cervical adenopathy.       Right: No supraclavicular adenopathy present.       Left: No supraclavicular adenopathy present.  Neurological: She is alert and oriented to person, place, and time. She has normal strength and normal reflexes. No cranial nerve deficit. She exhibits normal muscle tone. Coordination and gait normal.  Skin: Skin is warm, dry and intact. No rash noted. She is not diaphoretic. No cyanosis or erythema. Nails show no clubbing.  Psychiatric: She has a normal mood and affect. Her speech is normal and behavior is normal. Judgment and thought content normal.      Results for orders placed or performed in visit on 12/22/14  POCT  CBC  Result Value Ref Range   WBC 5.4 4.6 - 10.2 K/uL   Lymph, poc 1.6 0.6 - 3.4   POC LYMPH PERCENT 29.7 10 - 50 %L   MID (cbc) 0.4 0 - 0.9   POC MID % 7.9 0 - 12 %M   POC Granulocyte 3.4 2 - 6.9   Granulocyte percent 62.4 37 - 80 %G   RBC 4.78 4.04 - 5.48 M/uL   Hemoglobin 14.2 12.2 - 16.2 g/dL   HCT, POC 44.2 37.7 - 47.9 %   MCV 92.4 80 - 97 fL   MCH, POC 29.8 27 - 31.2 pg   MCHC 32.2 31.8 - 35.4 g/dL   RDW, POC 13.2 %   Platelet Count, POC 227 142 - 424 K/uL   MPV 7.3 0 - 99.8 fL  POCT UA - Microscopic Only  Result Value Ref Range   WBC, Ur, HPF, POC 1-3    RBC, urine, microscopic neg    Bacteria, U Microscopic trace    Mucus, UA neg    Epithelial cells, urine per micros 1-2    Crystals, Ur, HPF, POC neg    Casts, Ur, LPF, POC neg    Yeast, UA neg   POCT urinalysis dipstick  Result Value Ref Range   Color, UA yellow    Clarity, UA clear    Glucose, UA neg    Bilirubin, UA neg    Ketones, UA neg    Spec Grav, UA 1.010    Blood, UA neg    pH, UA 7.0    Protein, UA neg    Urobilinogen, UA 0.2    Nitrite, UA neg    Leukocytes, UA small (1+)        Assessment & Plan:  1. Annual physical exam Age appropriate anticipatory guidance provided.  2. Body mass index (BMI) of 24.0-24.9 in adult Normal.  3. Hematuria None today. - POCT UA - Microscopic Only - POCT urinalysis dipstick  4. Screening for diabetes mellitus - Comprehensive metabolic panel  5. Screening for deficiency anemia No anemia. - POCT CBC  6. Pterygium eye, left Continue to follow with eye specialist. No plans to remove  it until vision disturbance.   Fara Chute, PA-C Physician Assistant-Certified [facility]

## 2014-12-22 NOTE — Patient Instructions (Signed)
I will contact you with your lab results as soon as they are available.   If you have not heard from me in 2 weeks, please contact me.  The fastest way to get your results is to register for My Chart (see the instructions on the last page of this printout).  Keeping You Healthy  Get These Tests  Blood Pressure- Have your blood pressure checked by your healthcare provider at least once a year.  Normal blood pressure is 120/80.  Weight- Have your body mass index (BMI) calculated to screen for obesity.  BMI is a measure of body fat based on height and weight.  You can calculate your own BMI at www.nhlbisupport.com/bmi/  Cholesterol- Have your cholesterol checked every year.  Diabetes- Have your blood sugar checked every year if you have high blood pressure, high cholesterol, a family history of diabetes or if you are overweight.  Pap Smear- Have a pap smear every 1 to 3 years if you have been sexually active.  If you are older than 65 and recent pap smears have been normal you may not need additional pap smears.  In addition, if you have had a hysterectomy  For benign disease additional pap smears are not necessary.  Mammogram-Yearly mammograms are essential for early detection of breast cancer  Screening for Colon Cancer- Colonoscopy starting at age 50. Screening may begin sooner depending on your family history and other health conditions.  Follow up colonoscopy as directed by your Gastroenterologist.  Screening for Osteoporosis- Screening begins at age 65 with bone density scanning, sooner if you are at higher risk for developing Osteoporosis.  Get these medicines  Calcium with Vitamin D- Your body requires 1200-1500 mg of Calcium a day and 800-1000 IU of Vitamin D a day.  You can only absorb 500 mg of Calcium at a time therefore Calcium must be taken in 2 or 3 separate doses throughout the day.  Hormones- Hormone therapy has been associated with increased risk for certain cancers and  heart disease.  Talk to your healthcare provider about if you need relief from menopausal symptoms.  Aspirin- Ask your healthcare provider about taking Aspirin to prevent Heart Disease and Stroke.  Get these Immuniztions  Flu shot- Every fall  Pneumonia shot- Once after the age of 65; if you are younger ask your healthcare provider if you need a pneumonia shot.  Tetanus- Every ten years.  Zostavax- Once after the age of 60 to prevent shingles.  Take these steps  Don't smoke- Your healthcare provider can help you quit. For tips on how to quit, ask your healthcare provider or go to www.smokefree.gov or call 1-800 QUIT-NOW.  Be physically active- Exercise 5 days a week for a minimum of 30 minutes.  If you are not already physically active, start slow and gradually work up to 30 minutes of moderate physical activity.  Try walking, dancing, bike riding, swimming, etc.  Eat a healthy diet- Eat a variety of healthy foods such as fruits, vegetables, whole grains, low fat milk, low fat cheeses, yogurt, lean meats, chicken, fish, eggs, dried beans, tofu, etc.  For more information go to www.thenutritionsource.org  Dental visit- Brush and floss teeth twice daily; visit your dentist twice a year.  Eye exam- Visit your Optometrist or Ophthalmologist yearly.  Drink alcohol in moderation- Limit alcohol intake to one drink or less a day.  Never drink and drive.  Depression- Your emotional health is as important as your physical health.  If you're feeling   down or losing interest in things you normally enjoy, please talk to your healthcare provider.  Seat Belts- can save your life; always wear one  Smoke/Carbon Monoxide detectors- These detectors need to be installed on the appropriate level of your home.  Replace batteries at least once a year.  Violence- If anyone is threatening or hurting you, please tell your healthcare provider.  Living Will/ Health care power of attorney- Discuss with your  healthcare provider and family. 

## 2015-07-12 ENCOUNTER — Ambulatory Visit (INDEPENDENT_AMBULATORY_CARE_PROVIDER_SITE_OTHER): Payer: Medicare Other | Admitting: Physician Assistant

## 2015-07-12 VITALS — HR 88 | Temp 97.7°F | Resp 18

## 2015-07-12 DIAGNOSIS — R002 Palpitations: Secondary | ICD-10-CM | POA: Diagnosis not present

## 2015-07-12 NOTE — Progress Notes (Addendum)
Patient ID: Alicia Bass, female    DOB: [birth year], 69 y.o.   MRN: [id number]  PCP: Jenny Reichmann, MD  Subjective:   Chief Complaint  Patient presents with  . Hypertension    X yesterday  . Shortness of Breath    When she gets palpitations  . Palpitations    X 1.5 weeks    HPI Presents for evaluation of heart palpitations.  For the past week and a half, she's had more heart palpitations than usual. Seems worse the past couple of days, and when she saw that her blood pressure was elevated, she got scared.    Yesterday at work she developed a HA and took 2 tylenol and then was feeling bad. She asked the nurse to check her blood pressure (140/78). Thought she might be just getng anxious. But since her sister had a stroke due to uncontrolled HTN, she was extra nervous. At home that evening 129/78. This morning 165/91. She asked her husband to recheck it and it was 179/91.  When the palpitations occur they last only briefly (maybe 30 seconds), 3 times a day. Feels tightness but not pain in the center of her chest. Also feels SOB. Sometimes also dizziness. Also feels like she's having an anxiety attack.  She is worried about the health of her sisters and brother, a brother in law, her mother and mother in law, her husband, who has Parkinson's, and work stress.  Right now she feels a little dizzy in my head and "a little bit in here, pointing to her chest. Not the flip flops anymore, but just a little tight."  She saw cardiology last winter due to SOB and non-specific changes on ECG. ECHO was normal and she did not require follow-up.  Review of Systems  Constitutional: Negative for fever, chills, diaphoresis, activity change, appetite change, fatigue and unexpected weight change.  HENT: Negative for ear pain, hearing loss, rhinorrhea, sinus pressure, sore throat, trouble swallowing and voice change.   Eyes: Negative for visual disturbance.  Respiratory: Positive for chest tightness  and shortness of breath. Negative for apnea, cough, choking, wheezing and stridor.   Cardiovascular: Positive for palpitations. Negative for chest pain and leg swelling.  Gastrointestinal: Negative for nausea, vomiting and abdominal pain.  Endocrine: Negative for cold intolerance and heat intolerance.  Genitourinary: Negative for dysuria, urgency and frequency.  Musculoskeletal: Negative for myalgias, back pain and arthralgias.  Skin: Negative for rash and wound.  Neurological: Negative for dizziness, weakness and headaches.  Psychiatric/Behavioral: Negative for sleep disturbance, self-injury and dysphoric mood. The patient is nervous/anxious.        Patient Active Problem List   Diagnosis Date Noted  . Pterygium eye 12/22/2014  . Abnormal resting ECG findings 01/08/2014  . Microscopic hematuria 12/17/2013  . Fibromyalgia 12/12/2012  . Bladder cystocele 12/12/2012  . Osteoporosis 12/12/2012  . Hiatal hernia      Prior to Admission medications   Medication Sig Start Date End Date Taking? Authorizing Provider  Ascorbic Acid (VITAMIN C PO) Take by mouth daily.   Yes Historical Provider, MD  aspirin 81 MG tablet Take 81 mg by mouth daily.   Yes Historical Provider, MD  BIOTIN PO Take by mouth daily.   Yes Historical Provider, MD  glucosamine-chondroitin 500-400 MG tablet Take 1 tablet by mouth 3 (three) times daily.   Yes Historical Provider, MD  Multiple Vitamin (MULTIVITAMIN) tablet Take 1 tablet by mouth daily.   Yes Historical Provider, MD  OVER THE  COUNTER MEDICATION OTC B12 taking everyday   Yes Historical Provider, MD  OVER THE COUNTER MEDICATION OTC Ginge Root 550 mg taking everyday   Yes Historical Provider, MD  OVER THE COUNTER MEDICATION OTC Cinnamon 1000 mg taking everyday   Yes Historical Provider, MD  OVER THE COUNTER MEDICATION OTC Acid reflux med taking   Yes Historical Provider, MD  Cholecalciferol (VITAMIN D PO) Take by mouth daily.    Historical Provider, MD      Allergies  Allergen Reactions  . Asa [Aspirin] Other (See Comments)    "a whole big aspirin sets off my hiatal hernia." Tolerates low-dose.  . Codeine   . Midol [Aspirin-Cinnamedrine-Caffeine] Palpitations       Objective:  Physical Exam  Constitutional: She is oriented to person, place, and time. Vital signs are normal. She appears well-developed and well-nourished. She is active and cooperative. No distress.  Pulse 88  Temp(Src) 97.7 F (36.5 C) (Oral)  Resp 18  SpO2 97% blood pressure not documented, but performed, and normal  HENT:  Head: Normocephalic and atraumatic.  Right Ear: Hearing normal.  Left Ear: Hearing normal.  Eyes: Conjunctivae are normal. No scleral icterus.  Neck: Normal range of motion. Neck supple. No thyromegaly present.  Cardiovascular: Normal rate, regular rhythm, normal heart sounds and intact distal pulses.  Exam reveals no gallop and no friction rub.   No murmur heard. Pulses:      Radial pulses are 2+ on the right side, and 2+ on the left side.  Pulmonary/Chest: Effort normal and breath sounds normal.  Lymphadenopathy:       Head (right side): No tonsillar, no preauricular, no posterior auricular and no occipital adenopathy present.       Head (left side): No tonsillar, no preauricular, no posterior auricular and no occipital adenopathy present.    She has no cervical adenopathy.       Right: No supraclavicular adenopathy present.       Left: No supraclavicular adenopathy present.  Neurological: She is alert and oriented to person, place, and time. No sensory deficit.  Skin: Skin is warm, dry and intact. No rash noted. No cyanosis or erythema. Nails show no clubbing.  Psychiatric: She has a normal mood and affect. Her speech is normal and behavior is normal.       ECG REPORT  Date: 07/12/2015  EKG Time: 2:34 PM  Rate: 63   Rhythm: normal sinus rhythm,  normal EKG, normal sinus rhythm, unchanged from previous tracings, occasional PAC.   Narrative Interpretation: No ischemic changes.               Assessment & Plan:  1. Palpitation Reviewed with Dr. Joseph Art. Suspect that her symptoms are anxiety related, but given her family history of cardiovascular disease and age, she warrants re-evaluation with cardiology. Anticipate that she'll need an event/holter monitor. - EKG 12-Lead - Comprehensive metabolic panel - TSH - Ambulatory referral to Cardiology   Fara Chute, PA-C Physician Assistant-Certified [facility]  EKG and hx reviewed.  Agree with plan Robyn Haber, MD

## 2015-07-13 LAB — TSH: TSH: 2.707 u[IU]/mL (ref 0.350–4.500)

## 2015-07-13 LAB — COMPREHENSIVE METABOLIC PANEL
ALBUMIN: 4.4 g/dL (ref 3.5–5.2)
ALT: 20 U/L (ref 0–35)
AST: 19 U/L (ref 0–37)
Alkaline Phosphatase: 70 U/L (ref 39–117)
BUN: 13 mg/dL (ref 6–23)
CALCIUM: 9.6 mg/dL (ref 8.4–10.5)
CO2: 28 mEq/L (ref 19–32)
Chloride: 105 mEq/L (ref 96–112)
Creat: 0.93 mg/dL (ref 0.50–1.10)
GLUCOSE: 86 mg/dL (ref 70–99)
POTASSIUM: 4.9 meq/L (ref 3.5–5.3)
SODIUM: 145 meq/L (ref 135–145)
Total Bilirubin: 0.4 mg/dL (ref 0.2–1.2)
Total Protein: 6.5 g/dL (ref 6.0–8.3)

## 2015-09-29 ENCOUNTER — Encounter: Payer: Self-pay | Admitting: Internal Medicine

## 2015-09-30 ENCOUNTER — Encounter: Payer: Self-pay | Admitting: Emergency Medicine

## 2015-10-13 ENCOUNTER — Ambulatory Visit: Payer: Medicare Other | Admitting: Cardiovascular Disease

## 2015-11-04 ENCOUNTER — Encounter: Payer: Self-pay | Admitting: Cardiovascular Disease

## 2015-11-04 ENCOUNTER — Ambulatory Visit (INDEPENDENT_AMBULATORY_CARE_PROVIDER_SITE_OTHER): Payer: Medicare Other | Admitting: Cardiovascular Disease

## 2015-11-04 VITALS — BP 112/62 | HR 72 | Ht 62.0 in | Wt 145.0 lb

## 2015-11-04 DIAGNOSIS — R9431 Abnormal electrocardiogram [ECG] [EKG]: Secondary | ICD-10-CM | POA: Diagnosis not present

## 2015-11-04 NOTE — Patient Instructions (Signed)
Dr. Croitoru recommends that you schedule a follow-up appointment in: AS NEEDED   

## 2015-11-04 NOTE — Progress Notes (Signed)
Patient ID: Alicia Bass, female   DOB: 09-06-1946, 69 y.o.   MRN: [id number]     Cardiology Office Note   Date:  11/06/2015   ID:  Alicia Bass, DOB 10-29-1946, MRN [id number]  PCP:  Jenny Reichmann, MD  Cardiologist:   Sanda Klein, MD   Chief Complaint  Patient presents with  . Follow-up    Seen by her PCP in July due to frequent palpitations and SOB.  This was during a very stressful time at home with several sick family members.  Since these problems have resolved.      History of Present Illness: Alicia Bass is a 69 y.o. female who presents for complaints of palpitations and shortness of breath. When she was initially evaluated by her primary care physician in July the symptoms were very prevalent. She was "under a lot of emotional stress" with numerous family members that were ill or dying. As her social pressures have gradually improved, the symptoms have completely resolved. Her mother is also our patient and followed for her pacemaker here. At this point she is essentially asymptomatic. She has a very low risk profile for cardiac illness, without diabetes, hypertension, hyperlipidemia or other serious medical problems. She is physically active and has no complaints of exertional angina or dyspnea and no longer expenses palpitations. She has never had syncope. She denies edema, claudication and focal neurological deficits. She had a normal echocardiogram in 2015    Past Medical History  Diagnosis Date  . Basal cell carcinoma     LEFT anterior chest wall  . Shingles 2007  . Arthritis   . Allergy   . Fibromyalgia   . Hiatal hernia     Past Surgical History  Procedure Laterality Date  . Abdominal hysterectomy    . Cholecystectomy    . Excision      BCC, back     Current Outpatient Prescriptions  Medication Sig Dispense Refill  . Ascorbic Acid (VITAMIN C PO) Take by mouth daily.    Marland Kitchen aspirin 81 MG tablet Take 81 mg by mouth daily.    Marland Kitchen BIOTIN PO Take by mouth  daily.    . Cholecalciferol (VITAMIN D PO) Take by mouth daily.    Marland Kitchen glucosamine-chondroitin 500-400 MG tablet Take 1 tablet by mouth 3 (three) times daily.    . Multiple Vitamin (MULTIVITAMIN) tablet Take 1 tablet by mouth daily.    Marland Kitchen OVER THE COUNTER MEDICATION OTC B12 taking everyday    . OVER THE COUNTER MEDICATION OTC Ginge Root 550 mg taking everyday    . OVER THE COUNTER MEDICATION OTC Cinnamon 1000 mg taking everyday    . OVER THE COUNTER MEDICATION OTC Acid reflux med taking     No current facility-administered medications for this visit.    Allergies:   Asa; Codeine; and Midol    Social History:  The patient  reports that she has never smoked. She has never used smokeless tobacco. She reports that she drinks alcohol. She reports that she does not use illicit drugs.   Family History:  The patient's family history includes Cancer in her father, maternal uncle, and sister; Diabetes in her sister; Early death in her son; Heart disease in her brother, mother, sister, and sister; Kidney disease in her paternal aunt; Mental illness (age of onset: 48) in her son; Stroke in her sister.    ROS:  Please see the history of present illness.    Otherwise, review of systems positive for  none.   All other systems are reviewed and negative.    PHYSICAL EXAM: VS:  BP 112/62 mmHg  Pulse 72  Ht 5\' 2"  (1.575 m)  Wt 145 lb (65.772 kg)  BMI 26.51 kg/m2 , BMI Body mass index is 26.51 kg/(m^2).  General: Alert, oriented x3, no distress Head: no evidence of trauma, PERRL, EOMI, no exophtalmos or lid lag, no myxedema, no xanthelasma; normal ears, nose and oropharynx Neck: normal jugular venous pulsations and no hepatojugular reflux; brisk carotid pulses without delay and no carotid bruits Chest: clear to auscultation, no signs of consolidation by percussion or palpation, normal fremitus, symmetrical and full respiratory excursions Cardiovascular: normal position and quality of the apical impulse,  regular rhythm, normal first and second heart sounds, no murmurs, rubs or gallops Abdomen: no tenderness or distention, no masses by palpation, no abnormal pulsatility or arterial bruits, normal bowel sounds, no hepatosplenomegaly Extremities: no clubbing, cyanosis or edema; 2+ radial, ulnar and brachial pulses bilaterally; 2+ right femoral, posterior tibial and dorsalis pedis pulses; 2+ left femoral, posterior tibial and dorsalis pedis pulses; no subclavian or femoral bruits Neurological: grossly nonfocal Psych: euthymic mood, full affect   EKG:  EKG is not ordered today. The ekg ordered 07/12/2015 demonstrates normal sinus rhythm (minor non-specific ST segment changes)   Recent Labs: 12/22/2014: Hemoglobin 14.2 07/12/2015: ALT 20; BUN 13; Creat 0.93; Potassium 4.9; Sodium 145; TSH 2.707    Lipid Panel    Component Value Date/Time   CHOL 161 11/27/2013 1051   TRIG 90 11/27/2013 1051   HDL 46 11/27/2013 1051   CHOLHDL 3.5 11/27/2013 1051   VLDL 18 11/27/2013 1051   LDLCALC 97 11/27/2013 1051      Wt Readings from Last 3 Encounters:  11/04/15 145 lb (65.772 kg)  12/22/14 140 lb (63.504 kg)  08/25/14 140 lb 3.2 oz (63.594 kg)      Other studies Reviewed: Additional studies/ records that were reviewed today include: Occurs from primary care provider.   ASSESSMENT AND PLAN:  Palpitations and dyspnea associated with anxiety, now resolved. I agree with Mrs. Corwin that her symptoms are likely related to her severe emotional stress. Her physical exam is normal and her electrocardiogram shows at most borderline abnormalities. She had a normal echo last year. She has a low risk factor profile for coronary or other cardiac illness. At this point further evaluation does not appear to be necessary. She will call back if her symptoms start again.  Current medicines are reviewed at length with the patient today.  The patient does not have concerns regarding medicines.  The following  changes have been made:  no change  Labs/ tests ordered today include:  No orders of the defined types were placed in this encounter.    Patient Instructions  Dr. Sallyanne Kuster recommends that you schedule a follow-up appointment in: AS NEEDED    Signed, Sanda Klein, MD  11/06/2015 7:26 PM    Sanda Klein, MD, [facility] [phone] office [phone] pager

## 2015-12-23 ENCOUNTER — Other Ambulatory Visit: Payer: Self-pay | Admitting: Physician Assistant

## 2015-12-23 DIAGNOSIS — Z130 Encounter for screening for diseases of the blood and blood-forming organs and certain disorders involving the immune mechanism: Secondary | ICD-10-CM

## 2015-12-23 DIAGNOSIS — Z13220 Encounter for screening for lipoid disorders: Secondary | ICD-10-CM

## 2015-12-23 DIAGNOSIS — R319 Hematuria, unspecified: Secondary | ICD-10-CM

## 2015-12-23 DIAGNOSIS — Z0000 Encounter for general adult medical examination without abnormal findings: Secondary | ICD-10-CM

## 2015-12-23 DIAGNOSIS — Z131 Encounter for screening for diabetes mellitus: Secondary | ICD-10-CM

## 2015-12-23 DIAGNOSIS — Z6824 Body mass index (BMI) 24.0-24.9, adult: Secondary | ICD-10-CM

## 2015-12-23 DIAGNOSIS — Z1329 Encounter for screening for other suspected endocrine disorder: Secondary | ICD-10-CM

## 2015-12-24 ENCOUNTER — Ambulatory Visit (INDEPENDENT_AMBULATORY_CARE_PROVIDER_SITE_OTHER): Payer: Medicare Other | Admitting: Physician Assistant

## 2015-12-24 VITALS — BP 126/84 | HR 70 | Temp 97.9°F | Resp 16 | Ht 62.0 in | Wt 142.0 lb

## 2015-12-24 DIAGNOSIS — Z0000 Encounter for general adult medical examination without abnormal findings: Secondary | ICD-10-CM

## 2015-12-24 DIAGNOSIS — Z1231 Encounter for screening mammogram for malignant neoplasm of breast: Secondary | ICD-10-CM | POA: Diagnosis not present

## 2015-12-24 DIAGNOSIS — Z1329 Encounter for screening for other suspected endocrine disorder: Secondary | ICD-10-CM | POA: Diagnosis not present

## 2015-12-24 DIAGNOSIS — R319 Hematuria, unspecified: Secondary | ICD-10-CM

## 2015-12-24 DIAGNOSIS — Z23 Encounter for immunization: Secondary | ICD-10-CM

## 2015-12-24 DIAGNOSIS — Z13228 Encounter for screening for other metabolic disorders: Secondary | ICD-10-CM

## 2015-12-24 DIAGNOSIS — Z130 Encounter for screening for diseases of the blood and blood-forming organs and certain disorders involving the immune mechanism: Secondary | ICD-10-CM | POA: Diagnosis not present

## 2015-12-24 DIAGNOSIS — M810 Age-related osteoporosis without current pathological fracture: Secondary | ICD-10-CM

## 2015-12-24 DIAGNOSIS — Z131 Encounter for screening for diabetes mellitus: Secondary | ICD-10-CM

## 2015-12-24 LAB — POCT URINALYSIS DIP (MANUAL ENTRY)
BILIRUBIN UA: NEGATIVE
BILIRUBIN UA: NEGATIVE
Blood, UA: NEGATIVE
GLUCOSE UA: NEGATIVE
LEUKOCYTES UA: NEGATIVE
Nitrite, UA: NEGATIVE
PROTEIN UA: NEGATIVE
SPEC GRAV UA: 1.015
Urobilinogen, UA: 0.2
pH, UA: 6

## 2015-12-24 LAB — CBC WITH DIFFERENTIAL/PLATELET
Basophils Absolute: 0.1 10*3/uL (ref 0.0–0.1)
Basophils Relative: 2 % — ABNORMAL HIGH (ref 0–1)
Eosinophils Absolute: 0.1 10*3/uL (ref 0.0–0.7)
Eosinophils Relative: 2 % (ref 0–5)
HEMATOCRIT: 42.7 % (ref 36.0–46.0)
HEMOGLOBIN: 14.2 g/dL (ref 12.0–15.0)
LYMPHS PCT: 30 % (ref 12–46)
Lymphs Abs: 1.3 10*3/uL (ref 0.7–4.0)
MCH: 30.5 pg (ref 26.0–34.0)
MCHC: 33.3 g/dL (ref 30.0–36.0)
MCV: 91.6 fL (ref 78.0–100.0)
MONO ABS: 0.5 10*3/uL (ref 0.1–1.0)
MONOS PCT: 11 % (ref 3–12)
MPV: 10 fL (ref 8.6–12.4)
NEUTROS ABS: 2.3 10*3/uL (ref 1.7–7.7)
Neutrophils Relative %: 55 % (ref 43–77)
Platelets: 247 10*3/uL (ref 150–400)
RBC: 4.66 MIL/uL (ref 3.87–5.11)
RDW: 12.7 % (ref 11.5–15.5)
WBC: 4.2 10*3/uL (ref 4.0–10.5)

## 2015-12-24 LAB — POC MICROSCOPIC URINALYSIS (UMFC): MUCUS RE: ABSENT

## 2015-12-24 LAB — COMPREHENSIVE METABOLIC PANEL
ALBUMIN: 4.2 g/dL (ref 3.6–5.1)
ALK PHOS: 58 U/L (ref 33–130)
ALT: 19 U/L (ref 6–29)
AST: 18 U/L (ref 10–35)
BILIRUBIN TOTAL: 0.3 mg/dL (ref 0.2–1.2)
BUN: 13 mg/dL (ref 7–25)
CALCIUM: 9.2 mg/dL (ref 8.6–10.4)
CO2: 25 mmol/L (ref 20–31)
Chloride: 106 mmol/L (ref 98–110)
Creat: 0.74 mg/dL (ref 0.50–0.99)
Glucose, Bld: 100 mg/dL — ABNORMAL HIGH (ref 65–99)
Potassium: 4.3 mmol/L (ref 3.5–5.3)
Sodium: 142 mmol/L (ref 135–146)
TOTAL PROTEIN: 6.2 g/dL (ref 6.1–8.1)

## 2015-12-24 LAB — TSH: TSH: 2.003 u[IU]/mL (ref 0.350–4.500)

## 2015-12-24 NOTE — Progress Notes (Signed)
Patient ID: Alicia Bass, female    DOB: 10/08/1946, 69 y.o.   MRN: [id number]  PCP: Jenny Reichmann, MD  Chief Complaint  Patient presents with  . Annual Exam    Subjective:   HPI: Presents for annual exam. Had fasting labs drawn this morning.  Cervical Cancer Screening: s/p hysterectomy (ovaries remain) at age 30 due to "my white cells changed." Sounds like she had precancerous cells on the cervix. Was seeing Dr. Ree Edman then. Breast Cancer Screening: 12/2013. Colorectal Cancer Screening: 2013 Bone Density Testing: 12/2013 HIV Screening: not yet complete STI Screening: very low risk Seasonal Influenza Vaccination: complete Td/Tdap Vaccination: 05/2010 Pneumococcal Vaccination: pneumovax in 2014. Needs Prevnar-13 Zoster Vaccination: 2014 Frequency of Dental evaluation: Q6 months Frequency of Eye evaluation: annually   Patient Active Problem List   Diagnosis Date Noted  . Pterygium eye 12/22/2014  . Abnormal resting ECG findings 01/08/2014  . Microscopic hematuria 12/17/2013  . Fibromyalgia 12/12/2012  . Bladder cystocele 12/12/2012  . Osteoporosis 12/12/2012  . Hiatal hernia     Past Medical History  Diagnosis Date  . Basal cell carcinoma     LEFT anterior chest wall  . Shingles 2007  . Arthritis   . Allergy   . Fibromyalgia   . Hiatal hernia      Prior to Admission medications   Medication Sig Start Date End Date Taking? Authorizing Provider  aspirin 81 MG tablet Take 81 mg by mouth daily.   Yes Historical Provider, MD  Cholecalciferol (VITAMIN D PO) Take by mouth daily.   Yes Historical Provider, MD  glucosamine-chondroitin 500-400 MG tablet Take 1 tablet by mouth 3 (three) times daily.   Yes Historical Provider, MD  Multiple Vitamin (MULTIVITAMIN) tablet Take 1 tablet by mouth daily.   Yes Historical Provider, MD  OVER THE COUNTER MEDICATION OTC B12 taking everyday   Yes Historical Provider, MD  OVER THE COUNTER MEDICATION OTC Ginge Root 550 mg taking  everyday   Yes Historical Provider, MD  OVER THE COUNTER MEDICATION OTC Cinnamon 1000 mg taking everyday   Yes Historical Provider, MD  OVER THE COUNTER MEDICATION OTC Acid reflux med taking   Yes Historical Provider, MD  Ascorbic Acid (VITAMIN C PO) Take by mouth daily. Reported on 12/24/2015    Historical Provider, MD  BIOTIN PO Take by mouth daily. Reported on 12/24/2015    Historical Provider, MD    Allergies  Allergen Reactions  . Asa [Aspirin] Other (See Comments)    "a whole big aspirin sets off my hiatal hernia." Tolerates low-dose.  . Codeine   . Midol [Aspirin-Cinnamedrine-Caffeine] Palpitations    Past Surgical History  Procedure Laterality Date  . Abdominal hysterectomy    . Cholecystectomy    . Excision      BCC, back    Family History  Problem Relation Age of Onset  . Heart disease Mother   . Cancer Father   . Cancer Sister     "full blown cancer," "it started as a squeamish on her head"  . Heart disease Sister   . Diabetes Sister   . Heart disease Brother   . Early death Son   . Mental illness Son 70    bipolar disorder  . Cancer Maternal Uncle   . Kidney disease Paternal Aunt   . Stroke Sister   . Heart disease Sister     Social History   Social History  . Marital Status: Married    Spouse Name: Amoreena Stene  .  Number of Children: 5  . Years of Education: 12   Occupational History  . hairdresser    Social History Main Topics  . Smoking status: Never Smoker   . Smokeless tobacco: Never Used  . Alcohol Use: Yes     Comment: 1-2 annually  . Drug Use: No  . Sexual Activity:    Partners: Female    Patent examiner Protection: Surgical   Other Topics Concern  . None   Social History Narrative   Lives with husband who was diagnosed with Parkinson's in 2014.  Square dances, bowls and rides motorcycles with her husband. Her daughter and 2 grandchildren live with them.  Her son died by suicide at age 61.  Her husband also has 3 children from a  previous relationship.       Review of Systems  Constitutional: Negative.   HENT: Positive for sinus pressure. Negative for congestion, dental problem, rhinorrhea and sore throat.   Eyes: Positive for photophobia. Negative for pain and visual disturbance.  Respiratory: Negative.   Cardiovascular: Negative.   Gastrointestinal: Negative.   Endocrine: Negative.   Genitourinary: Negative.   Musculoskeletal: Positive for myalgias and neck stiffness. Negative for back pain, joint swelling, arthralgias, gait problem and neck pain.       "I have fibromyalgae."  Skin: Negative.   Allergic/Immunologic: Positive for environmental allergies. Negative for food allergies and immunocompromised state.  Neurological: Negative.   Hematological: Negative.   Psychiatric/Behavioral: Negative.         Objective:  Physical Exam  Constitutional: She is oriented to person, place, and time. Vital signs are normal. She appears well-developed and well-nourished. She is active and cooperative. No distress.  BP 126/84 mmHg  Pulse 70  Temp(Src) 97.9 F (36.6 C)  Resp 16  Ht 5\' 2"  (1.575 m)  Wt 142 lb (64.411 kg)  BMI 25.97 kg/m2  SpO2 98%   HENT:  Head: Normocephalic and atraumatic.  Right Ear: Hearing, tympanic membrane, external ear and ear canal normal. No foreign bodies.  Left Ear: Hearing, tympanic membrane, external ear and ear canal normal. No foreign bodies.  Nose: Nose normal.  Mouth/Throat: Uvula is midline, oropharynx is clear and moist and mucous membranes are normal. No oral lesions. Normal dentition. No dental abscesses or uvula swelling. No oropharyngeal exudate.  Eyes: Conjunctivae, EOM and lids are normal. Pupils are equal, round, and reactive to light. Right eye exhibits no discharge. Left eye exhibits no discharge. No scleral icterus.  Fundoscopic exam:      The right eye shows no arteriolar narrowing, no AV nicking, no exudate, no hemorrhage and no papilledema. The right eye shows  red reflex.       The left eye shows no arteriolar narrowing, no AV nicking, no exudate, no hemorrhage and no papilledema. The left eye shows red reflex.    Neck: Trachea normal, normal range of motion and full passive range of motion without pain. Neck supple. No spinous process tenderness and no muscular tenderness present. No thyroid mass and no thyromegaly present.  Cardiovascular: Normal rate, regular rhythm, normal heart sounds, intact distal pulses and normal pulses.   Pulmonary/Chest: Effort normal and breath sounds normal. Right breast exhibits no inverted nipple, no mass, no nipple discharge, no skin change and no tenderness. Left breast exhibits no inverted nipple, no mass, no nipple discharge, no skin change and no tenderness. Breasts are symmetrical.  Musculoskeletal: She exhibits no edema or tenderness.       Cervical back: Normal.  Thoracic back: Normal.       Lumbar back: Normal.  Lymphadenopathy:       Head (right side): No tonsillar, no preauricular, no posterior auricular and no occipital adenopathy present.       Head (left side): No tonsillar, no preauricular, no posterior auricular and no occipital adenopathy present.    She has no cervical adenopathy.       Right: No supraclavicular adenopathy present.       Left: No supraclavicular adenopathy present.  Neurological: She is alert and oriented to person, place, and time. She has normal strength and normal reflexes. No cranial nerve deficit. She exhibits normal muscle tone. Coordination and gait normal.  Skin: Skin is warm, dry and intact. No rash noted. She is not diaphoretic. No cyanosis or erythema. Nails show no clubbing.  Psychiatric: She has a normal mood and affect. Her speech is normal and behavior is normal. Judgment and thought content normal.           Assessment & Plan:  1. Annual physical exam Age appropriate anticipatory guidance provided.  2. Screening for deficiency anemia - CBC with  Differential/Platelet  3. Screening for diabetes mellitus 9. Screening for metabolic disorder - Comprehensive metabolic panel  4. Screening for thyroid disorder - TSH  5. Hematuria History of microscopic hematuria. - POCT urinalysis dipstick - POCT Microscopic Urinalysis ([facility])  6. Need for pneumococcal vaccination - Pneumococcal conjugate vaccine 13-valent IM  7. Osteoporosis - DG Bone Density; Future  8. Encounter for screening mammogram for breast cancer - MM Digital Screening; Future    Fara Chute, PA-C Physician Assistant-Certified [facility]

## 2015-12-24 NOTE — Patient Instructions (Signed)
I will contact you with your lab results as soon as they are available.   If you have not heard from me in 2 weeks, please contact me.  The fastest way to get your results is to register for My Chart (see the instructions on the last page of this printout).  Keeping You Healthy  Get These Tests  Blood Pressure- Have your blood pressure checked by your healthcare provider at least once a year.  Normal blood pressure is 120/80.  Weight- Have your body mass index (BMI) calculated to screen for obesity.  BMI is a measure of body fat based on height and weight.  You can calculate your own BMI at www.nhlbisupport.com/bmi/  Cholesterol- Have your cholesterol checked every year.  Diabetes- Have your blood sugar checked every year if you have high blood pressure, high cholesterol, a family history of diabetes or if you are overweight.  Pap Test - Have a pap test every 1 to 5 years if you have been sexually active.  If you are older than 65 and recent pap tests have been normal you may not need additional pap tests.  In addition, if you have had a hysterectomy  for benign disease additional pap tests are not necessary.  Mammogram-Yearly mammograms are essential for early detection of breast cancer  Screening for Colon Cancer- Colonoscopy starting at age 50. Screening may begin sooner depending on your family history and other health conditions.  Follow up colonoscopy as directed by your Gastroenterologist.  Screening for Osteoporosis- Screening begins at age 65 with bone density scanning, sooner if you are at higher risk for developing Osteoporosis.  Get these medicines  Calcium with Vitamin D- Your body requires 1200-1500 mg of Calcium a day and 800-1000 IU of Vitamin D a day.  You can only absorb 500 mg of Calcium at a time therefore Calcium must be taken in 2 or 3 separate doses throughout the day.  Hormones- Hormone therapy has been associated with increased risk for certain cancers and heart  disease.  Talk to your healthcare provider about if you need relief from menopausal symptoms.  Aspirin- Ask your healthcare provider about taking Aspirin to prevent Heart Disease and Stroke.  Get these Immuniztions  Flu shot- Every fall  Pneumonia shot- Once after the age of 65; if you are younger ask your healthcare provider if you need a pneumonia shot.  Tetanus- Every ten years.  Zostavax- Once after the age of 60 to prevent shingles.  Take these steps  Don't smoke- Your healthcare provider can help you quit. For tips on how to quit, ask your healthcare provider or go to www.smokefree.gov or call 1-800 QUIT-NOW.  Be physically active- Exercise 5 days a week for a minimum of 30 minutes.  If you are not already physically active, start slow and gradually work up to 30 minutes of moderate physical activity.  Try walking, dancing, bike riding, swimming, etc.  Eat a healthy diet- Eat a variety of healthy foods such as fruits, vegetables, whole grains, low fat milk, low fat cheeses, yogurt, lean meats, chicken, fish, eggs, dried beans, tofu, etc.  For more information go to www.thenutritionsource.org  Dental visit- Brush and floss teeth twice daily; visit your dentist twice a year.  Eye exam- Visit your Optometrist or Ophthalmologist yearly.  Drink alcohol in moderation- Limit alcohol intake to one drink or less a day.  Never drink and drive.  Depression- Your emotional health is as important as your physical health.  If you're   feeling down or losing interest in things you normally enjoy, please talk to your healthcare provider.  Seat Belts- can save your life; always wear one  Smoke/Carbon Monoxide detectors- These detectors need to be installed on the appropriate level of your home.  Replace batteries at least once a year.  Violence- If anyone is threatening or hurting you, please tell your healthcare provider.  Living Will/ Health care power of attorney- Discuss with your  healthcare provider and family. 

## 2015-12-26 ENCOUNTER — Other Ambulatory Visit: Payer: Self-pay

## 2015-12-26 DIAGNOSIS — Z1231 Encounter for screening mammogram for malignant neoplasm of breast: Secondary | ICD-10-CM

## 2015-12-26 DIAGNOSIS — E2839 Other primary ovarian failure: Secondary | ICD-10-CM

## 2016-01-13 ENCOUNTER — Encounter: Payer: Medicare Other | Admitting: Physician Assistant

## 2016-02-03 ENCOUNTER — Ambulatory Visit
Admission: RE | Admit: 2016-02-03 | Discharge: 2016-02-03 | Disposition: A | Discharge status: Home / Self Care | Payer: Medicare Other | Source: Ambulatory Visit | Attending: Physician Assistant | Admitting: Physician Assistant

## 2016-02-03 DIAGNOSIS — E2839 Other primary ovarian failure: Secondary | ICD-10-CM

## 2016-02-03 DIAGNOSIS — Z1231 Encounter for screening mammogram for malignant neoplasm of breast: Secondary | ICD-10-CM

## 2016-11-30 ENCOUNTER — Ambulatory Visit (INDEPENDENT_AMBULATORY_CARE_PROVIDER_SITE_OTHER): Payer: Medicare Other | Admitting: Physician Assistant

## 2016-11-30 ENCOUNTER — Emergency Department (HOSPITAL_COMMUNITY)
Admission: EM | Admit: 2016-11-30 | Discharge: 2016-11-30 | Disposition: A | Discharge status: Home / Self Care | Payer: Medicare Other | Attending: Emergency Medicine | Admitting: Emergency Medicine

## 2016-11-30 ENCOUNTER — Ambulatory Visit (INDEPENDENT_AMBULATORY_CARE_PROVIDER_SITE_OTHER): Payer: Medicare Other

## 2016-11-30 ENCOUNTER — Encounter: Payer: Medicare Other | Admitting: Physician Assistant

## 2016-11-30 ENCOUNTER — Telehealth (HOSPITAL_COMMUNITY): Payer: Self-pay | Admitting: *Deleted

## 2016-11-30 VITALS — BP 110/80 | HR 100 | Temp 99.3°F | Resp 17 | Ht 62.25 in | Wt 139.0 lb

## 2016-11-30 DIAGNOSIS — Z85828 Personal history of other malignant neoplasm of skin: Secondary | ICD-10-CM | POA: Insufficient documentation

## 2016-11-30 DIAGNOSIS — R0989 Other specified symptoms and signs involving the circulatory and respiratory systems: Secondary | ICD-10-CM | POA: Diagnosis not present

## 2016-11-30 DIAGNOSIS — R69 Illness, unspecified: Secondary | ICD-10-CM

## 2016-11-30 DIAGNOSIS — Z7982 Long term (current) use of aspirin: Secondary | ICD-10-CM | POA: Diagnosis not present

## 2016-11-30 DIAGNOSIS — I480 Paroxysmal atrial fibrillation: Secondary | ICD-10-CM | POA: Insufficient documentation

## 2016-11-30 DIAGNOSIS — J111 Influenza due to unidentified influenza virus with other respiratory manifestations: Secondary | ICD-10-CM

## 2016-11-30 DIAGNOSIS — R002 Palpitations: Secondary | ICD-10-CM

## 2016-11-30 DIAGNOSIS — I481 Persistent atrial fibrillation: Secondary | ICD-10-CM

## 2016-11-30 DIAGNOSIS — R509 Fever, unspecified: Secondary | ICD-10-CM

## 2016-11-30 DIAGNOSIS — J189 Pneumonia, unspecified organism: Secondary | ICD-10-CM

## 2016-11-30 DIAGNOSIS — Z79899 Other long term (current) drug therapy: Secondary | ICD-10-CM | POA: Insufficient documentation

## 2016-11-30 DIAGNOSIS — J181 Lobar pneumonia, unspecified organism: Secondary | ICD-10-CM

## 2016-11-30 DIAGNOSIS — J1108 Influenza due to unidentified influenza virus with specified pneumonia: Secondary | ICD-10-CM | POA: Diagnosis not present

## 2016-11-30 DIAGNOSIS — I4819 Other persistent atrial fibrillation: Secondary | ICD-10-CM

## 2016-11-30 LAB — POCT CBC
Granulocyte percent: 81.2 %G — AB (ref 37–80)
HEMATOCRIT: 44 % (ref 37.7–47.9)
HEMOGLOBIN: 15.5 g/dL (ref 12.2–16.2)
Lymph, poc: 0.9 (ref 0.6–3.4)
MCH: 31.3 pg — AB (ref 27–31.2)
MCHC: 35.2 g/dL (ref 31.8–35.4)
MCV: 88.9 fL (ref 80–97)
MID (CBC): 0.4 (ref 0–0.9)
MPV: 7.8 fL (ref 0–99.8)
POC GRANULOCYTE: 5.6 (ref 2–6.9)
POC LYMPH PERCENT: 13.1 %L (ref 10–50)
POC MID %: 5.7 % (ref 0–12)
Platelet Count, POC: 182 10*3/uL (ref 142–424)
RBC: 4.95 M/uL (ref 4.04–5.48)
RDW, POC: 13.1 %
WBC: 6.9 10*3/uL (ref 4.6–10.2)

## 2016-11-30 LAB — POCT INFLUENZA A/B
Influenza A, POC: POSITIVE — AB
Influenza B, POC: NEGATIVE

## 2016-11-30 LAB — BASIC METABOLIC PANEL
ANION GAP: 8 (ref 5–15)
BUN: 15 mg/dL (ref 6–20)
CO2: 21 mmol/L — ABNORMAL LOW (ref 22–32)
Calcium: 7.9 mg/dL — ABNORMAL LOW (ref 8.9–10.3)
Chloride: 109 mmol/L (ref 101–111)
Creatinine, Ser: 0.89 mg/dL (ref 0.44–1.00)
GFR calc Af Amer: >60 mL/min (ref >60)
Glucose, Bld: 100 mg/dL — ABNORMAL HIGH (ref 65–99)
POTASSIUM: 3.8 mmol/L (ref 3.5–5.1)
SODIUM: 138 mmol/L (ref 135–145)

## 2016-11-30 LAB — I-STAT TROPONIN, ED: Troponin i, poc: 0.03 ng/mL (ref 0.00–0.08)

## 2016-11-30 MED ORDER — ALBUTEROL SULFATE HFA 108 (90 BASE) MCG/ACT IN AERS
1.0000 | INHALATION_SPRAY | RESPIRATORY_TRACT | Status: DC | PRN
  Filled 2016-11-30 (×2): qty 6.7

## 2016-11-30 MED ORDER — ALBUTEROL SULFATE HFA 108 (90 BASE) MCG/ACT IN AERS: 2.0000 | INHALATION_SPRAY | RESPIRATORY_TRACT | 0 refills | Status: DC | PRN

## 2016-11-30 MED ORDER — AZITHROMYCIN 250 MG PO TABS: ORAL_TABLET | ORAL | 0 refills | Status: DC

## 2016-11-30 MED ORDER — IPRATROPIUM BROMIDE 0.02 % IN SOLN
0.5000 mg | Freq: Once | RESPIRATORY_TRACT | Status: AC
  Administered 2016-11-30: 0.5 mg via RESPIRATORY_TRACT
  Filled 2016-11-30: qty 2.5

## 2016-11-30 MED ORDER — OSELTAMIVIR PHOSPHATE 75 MG PO CAPS: 75.0000 mg | ORAL_CAPSULE | Freq: Two times a day (BID) | ORAL | 0 refills | Status: DC

## 2016-11-30 MED ORDER — DOXYCYCLINE HYCLATE 100 MG PO CAPS: 100.0000 mg | ORAL_CAPSULE | Freq: Two times a day (BID) | ORAL | 0 refills | Status: AC

## 2016-11-30 MED ORDER — ALBUTEROL SULFATE (2.5 MG/3ML) 0.083% IN NEBU
5.0000 mg | INHALATION_SOLUTION | Freq: Once | RESPIRATORY_TRACT | Status: AC
  Administered 2016-11-30: 5 mg via RESPIRATORY_TRACT
  Filled 2016-11-30: qty 6

## 2016-11-30 MED ORDER — ACETAMINOPHEN 500 MG PO TABS
1000.0000 mg | ORAL_TABLET | Freq: Once | ORAL | Status: AC
  Administered 2016-11-30: 1000 mg via ORAL

## 2016-11-30 MED ORDER — ASPIRIN 81 MG PO CHEW
324.0000 mg | CHEWABLE_TABLET | Freq: Once | ORAL | Status: AC
  Administered 2016-11-30: 324 mg via ORAL

## 2016-11-30 NOTE — ED Triage Notes (Signed)
Patient from MD office where she tested positive for  Flu.  She was found to have left lung pneumonia and uncontrolled a-fib.  Patient given 324 mg asa by MD.  Chancy Hurter to [facility] ED via EMS

## 2016-11-30 NOTE — Progress Notes (Signed)
11/30/2016 11:25 AM   DOB: 1946-03-25 / MRN: [id number]  SUBJECTIVE:  Alicia Bass is a 70 y.o. female presenting for fever that started on Monday tmax 100.  Complains of cough, HA which has resolved.  Has tried mucinex and tylenol and this helped some.  States "I haven't been this sick in years." She complains of muscles aches in her neck and shoulders along with palpitations.  Reports she was perfectly well the day before this illness started.    She has had the seasonal flu shot.   She is allergic to asa [aspirin]; codeine; and midol [aspirin-cinnamedrine-caffeine].   She  has a past medical history of Allergy; Arthritis; Basal cell carcinoma; Fibromyalgia; Hiatal hernia; and Shingles (2007).    She  reports that she has never smoked. She has never used smokeless tobacco. She reports that she drinks alcohol. She reports that she does not use drugs. She  reports that she currently engages in sexual activity and has had female partners. She reports using the following method of birth control/protection: Surgical. The patient  has a past surgical history that includes Abdominal hysterectomy; Cholecystectomy; and excision.  Her family history includes Cancer in her father, maternal uncle, and sister; Diabetes in her sister; Early death in her son; Heart disease in her brother, mother, sister, and sister; Kidney disease in her paternal aunt; Mental illness (age of onset: 53) in her son; Stroke in her sister.  Review of Systems  Constitutional: Positive for chills, fever and malaise/fatigue.  Respiratory: Negative for cough.   Cardiovascular: Negative for chest pain.  Gastrointestinal: Negative for nausea.  Musculoskeletal: Positive for myalgias.  Skin: Negative for itching and rash.  Neurological: Negative for dizziness.    The problem list and medications were reviewed and updated by myself where necessary and exist elsewhere in the encounter.   OBJECTIVE:  BP 110/80 (BP Location: Right  Arm, Patient Position: Sitting, Cuff Size: Normal)   Pulse 100   Temp 99.3 F (37.4 C) (Oral)   Resp 17   Ht 5' 2.25" (1.581 m)   Wt 139 lb (63 kg)   SpO2 96%   BMI 25.22 kg/m   Physical Exam  Constitutional: She is oriented to person, place, and time.  HENT:  Right Ear: External ear normal.  Left Ear: External ear normal.  Nose: Mucosal edema present. Right sinus exhibits no maxillary sinus tenderness and no frontal sinus tenderness. Left sinus exhibits no maxillary sinus tenderness and no frontal sinus tenderness.  Mouth/Throat: Oropharynx is clear and moist. No oropharyngeal exudate.  Eyes: Conjunctivae are normal. Pupils are equal, round, and reactive to light.  Cardiovascular: Regular rhythm and normal heart sounds.   Extrasystoles are present. Tachycardia present.   Pulmonary/Chest: Effort normal. She has rales (left lower lobe).  Neurological: She is alert and oriented to person, place, and time.  Skin: Skin is warm and dry. No rash noted. She is not diaphoretic. No erythema.  Psychiatric: Her behavior is normal.    Results for orders placed or performed in visit on 11/30/16 (from the past 72 hour(s))  POCT CBC     Status: Abnormal   Collection Time: 11/30/16 11:10 AM  Result Value Ref Range   WBC 6.9 4.6 - 10.2 K/uL   Lymph, poc 0.9 0.6 - 3.4   POC LYMPH PERCENT 13.1 10 - 50 %L   MID (cbc) 0.4 0 - 0.9   POC MID % 5.7 0 - 12 %M   POC Granulocyte 5.6 2 -  6.9   Granulocyte percent 81.2 (A) 37 - 80 %G   RBC 4.95 4.04 - 5.48 M/uL   Hemoglobin 15.5 12.2 - 16.2 g/dL   HCT, POC 44.0 37.7 - 47.9 %   MCV 88.9 80 - 97 fL   MCH, POC 31.3 (A) 27 - 31.2 pg   MCHC 35.2 31.8 - 35.4 g/dL   RDW, POC 13.1 %   Platelet Count, POC 182 142 - 424 K/uL   MPV 7.8 0 - 99.8 fL  POCT Influenza A/B     Status: Abnormal   Collection Time: 11/30/16 11:19 AM  Result Value Ref Range   Influenza A, POC Positive (A) Negative   Influenza B, POC Negative Negative   EKG: Irregularly irregular.     Dg Chest 2 View  Result Date: 11/30/2016 CLINICAL DATA:  Cough, fever, tachycardia.  Nonsmoker. EXAM: CHEST  2 VIEW COMPARISON:  Report only of a chest x-ray of November 12, 2000 FINDINGS: The lungs are well-expanded. There is no focal infiltrate. There is subtle density that projects in the retro sternal region likely in the lingula that may be acute or old. There is bilateral mild apical pleural thickening. There is no pleural effusion. The heart and pulmonary vascularity are normal. The mediastinum is normal in width. There is no pleural effusion. The bony thorax is unremarkable. IMPRESSION: Possible lingular atelectasis or early pneumonia. Otherwise there no active cardiopulmonary disease. Given the patient's symptoms and lack of previous images to review, followup PA and lateral chest X-ray is recommended in 3-4 weeks following trial of antibiotic therapy to ensure resolution and exclude underlying malignancy. Electronically Signed   By: David  Martinique M.D.   On: 11/30/2016 11:14    ASSESSMENT AND PLAN  Alicia Bass was seen today for generalized body aches, sore throat, diarrhea and irregular heart beat.  Diagnoses and all orders for this visit:  Fever, unspecified fever cause: New onset afib with flu and pneumonia.  [facility] to [facility] for [facility] and management.  She is not having chest pain and SOB at this time.   -     POCT CBC -     acetaminophen (TYLENOL) tablet 1,000 mg; Take 2 tablets (1,000 mg total) by mouth once.  Rales: Rads showing an early pneumonia which correlates with physical exam. Given positive flu I am going to treat her with doxycycline as well as Azith to cover for a staph pneumonia along with more usual organisms.   -     DG Chest 2 View; Future  Influenza-like illness -     POCT Influenza A/B  Palpitations -     EKG 12-Lead    The patient is advised to call or return to clinic if she does not see an improvement in symptoms, or to seek the care of  the closest emergency department if she worsens with the above plan.   Philis Fendt, MHS, PA-C [facility] 11/30/2016 11:25 AM

## 2016-11-30 NOTE — ED Provider Notes (Signed)
[facility] DEPT Provider Note   CSN: [id number] Arrival date & time: 11/30/16  1216     History   Chief Complaint Chief Complaint  Patient presents with  . Atrial Fibrillation  . Influenza  . Pneumonia    HPI Alicia Bass is a 70 y.o. female with a PMHx of fibromyalgia, shingles, hiatal hernia, bladder cystocele, and osteoporosis, who presents to the ED via EMS from [facility] where she was seen just prior to arrival, seen there for subjective fevers, nonproductive cough, resolved headache, myalgias, and 2-3 episodes/day of looser than normal but nonbloody and nonwatery diarrhea ongoing x1 day, states that she started feeling under the weather on Saturday 2 days ago, but yesterday is when she developed all of her symptoms. She has been taking Tylenol and Mucinex with improvement of her symptoms, no known aggravating factors. At [facility], she had a CXR that showed a probable L lingula PNA and a +Flu test, then found to be in Afib w/ RVR (new onset) so she was sent here. Also had CBC w/diff that was unremarkable. Rx's for Azithromycin and Doxycycline were called into her pharmacy for the PNA, and Rx for Tamiflu was also called in.  Patient states that she had no idea that she was in Afib, denies ever having any symptoms, and denies ever having that diagnosis previously-- although she states that her PCP has had her seen a cardiologist because the PCP thought she may have something wrong with her rhythm, but when she's seen the cardiologist they've never seemed to "catch it". She does have 2 sisters that have diagnosis of Afib and diabetes, but reports that she is an otherwise healthy individual with no personal history of diabetes, hypertension, hyperlipidemia, Afib or any other significant comorbidities. Upon arrival to the ER, initial EKG showed Afib with RVR with a heart rate of 131, but during exam she spontaneously converted and is in regular rhythm with a heart rate in the 80s throughout  the exam.   Aside from her nonproductive cough, subjective fevers, body aches, and diarrhea complaints, she denies any other complaints. She denies any rhinorrhea, sore throat, ear pain or drainage, chest pain, shortness breath, palpitations, wheezing, leg swelling, recent surgery/immobilization, estrogen use, personal or family history of DVT/PE, lightheadedness, abdominal pain, nausea, vomiting, constipation, melena, hematochezia, dysuria, hematuria, numbness, tingling, or focal weakness. She is a nonsmoker. +Sick contacts, states that her granddaughter came home sick on Friday and is likely the source of the patient's current illness. Patient states that her mother had an MI in her 32s, she is now 52 years old and doing well, no other family history of cardiac illness aside from her sister's with atrial fibrillation. Patient states that she recently traveled to [facility], then to [facility], then to [facility]. States that all of her travels were by car, the longest travel was 10 hours but she states that she stopped several times with breaks to walk/stretch throughout that travel so that it wasn't very prolonged.    The history is provided by the patient and medical records. No language interpreter was used.  Atrial Fibrillation  This is a new problem. Episode onset: unsure. Episode frequency: pt unsure; based on current exam showing that she's no longer in afib, it seems intermittent. The problem has been resolved. Pertinent negatives include no chest pain, no abdominal pain and no shortness of breath. Nothing aggravates the symptoms. Nothing relieves the symptoms. She has tried nothing for the symptoms. The treatment provided  no relief.  Influenza  Presenting symptoms: cough, diarrhea, fever (subjective) and myalgias   Presenting symptoms: no nausea, no rhinorrhea, no shortness of breath, no sore throat and no vomiting   Associated symptoms: no ear pain   Pneumonia  Pertinent  negatives include no chest pain, no abdominal pain and no shortness of breath.  Cough  This is a new problem. The current episode started 2 days ago. The problem occurs constantly. The problem has not changed since onset.The cough is non-productive. There has been no fever (subjective fever, unsure how high). Associated symptoms include myalgias. Pertinent negatives include no chest pain, no ear pain, no rhinorrhea, no sore throat, no shortness of breath and no wheezing. She has tried decongestants for the symptoms. The treatment provided mild relief. She is not a smoker. Her past medical history does not include COPD or asthma.    Past Medical History:  Diagnosis Date  . Allergy   . Arthritis   . Basal cell carcinoma    LEFT anterior chest wall  . Fibromyalgia   . Hiatal hernia   . Shingles 2007    Patient Active Problem List   Diagnosis Date Noted  . Pterygium eye 12/22/2014  . Abnormal resting ECG findings 01/08/2014  . Microscopic hematuria 12/17/2013  . Fibromyalgia 12/12/2012  . Bladder cystocele 12/12/2012  . Osteoporosis 12/12/2012  . Hiatal hernia     Past Surgical History:  Procedure Laterality Date  . ABDOMINAL HYSTERECTOMY    . CHOLECYSTECTOMY    . excision     BCC, back    OB History    No data available       Home Medications    Prior to Admission medications   Medication Sig Start Date End Date Taking? Authorizing Provider  Ascorbic Acid (VITAMIN C PO) Take by mouth daily. Reported on 12/24/2015    Historical Provider, MD  aspirin 81 MG tablet Take 81 mg by mouth daily.    Historical Provider, MD  azithromycin (ZITHROMAX) 250 MG tablet Take two on day one and one daily thereafter. 11/30/16   Tereasa Coop, PA-C  Cholecalciferol (VITAMIN D PO) Take by mouth daily.    Historical Provider, MD  doxycycline (VIBRAMYCIN) 100 MG capsule Take 1 capsule (100 mg total) by mouth 2 (two) times daily. 11/30/16 12/10/16  Tereasa Coop, PA-C    glucosamine-chondroitin 500-400 MG tablet Take 1 tablet by mouth 3 (three) times daily.    Historical Provider, MD  Multiple Vitamin (MULTIVITAMIN) tablet Take 1 tablet by mouth daily.    Historical Provider, MD  oseltamivir (TAMIFLU) 75 MG capsule Take 1 capsule (75 mg total) by mouth 2 (two) times daily. 11/30/16   Tereasa Coop, PA-C  OVER THE COUNTER MEDICATION OTC Ginge Root 550 mg taking everyday    Historical Provider, MD  OVER THE COUNTER MEDICATION OTC Cinnamon 1000 mg taking everyday    Historical Provider, MD  OVER THE COUNTER MEDICATION OTC Acid reflux med taking    Historical Provider, MD    Family History Family History  Problem Relation Age of Onset  . Heart disease Mother   . Cancer Father   . Cancer Sister     "full blown cancer," "it started as a squeamish on her head"  . Heart disease Sister   . Diabetes Sister   . Heart disease Brother   . Early death Son   . Mental illness Son 37    bipolar disorder  . Cancer Maternal Uncle   .  Kidney disease Paternal Aunt   . Stroke Sister   . Heart disease Sister     Social History Social History  Substance Use Topics  . Smoking status: Never Smoker  . Smokeless tobacco: Never Used  . Alcohol use Yes     Comment: 1-2 annually     Allergies   Asa [aspirin]; Codeine; and Midol [aspirin-cinnamedrine-caffeine]   Review of Systems Review of Systems  Constitutional: Positive for fever (subjective).  HENT: Negative for ear discharge, ear pain, rhinorrhea and sore throat.   Respiratory: Positive for cough. Negative for shortness of breath and wheezing.   Cardiovascular: Negative for chest pain, palpitations and leg swelling.  Gastrointestinal: Positive for diarrhea. Negative for abdominal pain, blood in stool, constipation, nausea and vomiting.  Genitourinary: Negative for dysuria and hematuria.  Musculoskeletal: Positive for myalgias. Negative for arthralgias.  Skin: Negative for color change.   Allergic/Immunologic: Negative for immunocompromised state.  Neurological: Negative for weakness, light-headedness and numbness.  Psychiatric/Behavioral: Negative for confusion.   10 Systems reviewed and are negative for acute change except as noted in the HPI.   Physical Exam Updated Vital Signs BP 126/79   Pulse 93   Temp 98.9 F (37.2 C) (Oral)   Resp 16   Ht 5\' 2"  (1.575 m)   Wt 63 kg   SpO2 98%   BMI 25.40 kg/m     Physical Exam  Constitutional: She is oriented to person, place, and time. Vital signs are normal. She appears well-developed and well-nourished.  Non-toxic appearance. No distress.  Afebrile, nontoxic, NAD  HENT:  Head: Normocephalic and atraumatic.  Mouth/Throat: Oropharynx is clear and moist and mucous membranes are normal.  Eyes: Conjunctivae and EOM are normal. Right eye exhibits no discharge. Left eye exhibits no discharge.  Neck: Normal range of motion. Neck supple.  Cardiovascular: Normal rate, regular rhythm, normal heart sounds and intact distal pulses.  Exam reveals no gallop and no friction rub.   No murmur heard. During exam, RRR, nl s1/s2, no m/r/g, distal pulses intact, no pedal edema; monitor tracing showing sinus rhythm  Pulmonary/Chest: Effort normal. No respiratory distress. She has decreased breath sounds in the left lower field. She has no wheezes. She has rhonchi. She has no rales.  Mildly diminished sounds in LLF, which some mild rhonchi particularly in the LLF but somewhat scattered bilaterally, no wheezing or rales, no hypoxia or increased WOB, speaking in full sentences, SpO2 98% on RA   Abdominal: Soft. Normal appearance and bowel sounds are normal. She exhibits no distension. There is no tenderness. There is no rigidity, no rebound, no guarding, no CVA tenderness, no tenderness at McBurney's point and negative Murphy's sign.  Musculoskeletal: Normal range of motion.  MAE x4 Strength and sensation grossly intact in all  extremities Distal pulses intact Gait steady No pedal edema, neg homan's bilaterally   Neurological: She is alert and oriented to person, place, and time. She has normal strength. No sensory deficit.  Skin: Skin is warm, dry and intact. No rash noted.  Psychiatric: She has a normal mood and affect.  Nursing note and vitals reviewed.    ED Treatments / Results  Labs (all labs ordered are listed, but only abnormal results are displayed) Labs Reviewed  BASIC METABOLIC PANEL - Abnormal; Notable for the following:       Result Value   CO2 21 (*)    Glucose, Bld 100 (*)    Calcium 7.9 (*)    All other components within normal  limits  I-STAT TROPOININ, ED   Results for orders placed or performed in visit on 11/30/16  POCT CBC  Result Value Ref Range   WBC 6.9 4.6 - 10.2 K/uL   Lymph, poc 0.9 0.6 - 3.4   POC LYMPH PERCENT 13.1 10 - 50 %L   MID (cbc) 0.4 0 - 0.9   POC MID % 5.7 0 - 12 %M   POC Granulocyte 5.6 2 - 6.9   Granulocyte percent 81.2 (A) 37 - 80 %G   RBC 4.95 4.04 - 5.48 M/uL   Hemoglobin 15.5 12.2 - 16.2 g/dL   HCT, POC 44.0 37.7 - 47.9 %   MCV 88.9 80 - 97 fL   MCH, POC 31.3 (A) 27 - 31.2 pg   MCHC 35.2 31.8 - 35.4 g/dL   RDW, POC 13.1 %   Platelet Count, POC 182 142 - 424 K/uL   MPV 7.8 0 - 99.8 fL  POCT Influenza A/B  Result Value Ref Range   Influenza A, POC Positive (A) Negative   Influenza B, POC Negative Negative    EKG  EKG Interpretation  Date/Time:  Tuesday November 30 2016 13:05:29 EST Ventricular Rate:  82 PR Interval:    QRS Duration: 97 QT Interval:  369 QTC Calculation: 431 R Axis:   75 Text Interpretation:  Sinus rhythm Minimal ST depression, inferior leads Afib resolved Confirmed by GOLDSTON MD, SCOTT [phone]) on 11/30/2016 1:18:39 PM      EKG Interpretation- ARRIVAL EKG  Date/Time:  Tuesday November 30 2016 12:26:18 EST Ventricular Rate:  131 PR Interval:    QRS Duration: 95 QT Interval:  321 QTC Calculation: 474 R  Axis:   82 Text Interpretation:  Atrial fibrillation with rvr Borderline right axis deviation Borderline repolarization abnormality No old tracing to compare Confirmed by GOLDSTON MD, SCOTT [phone]) on 11/30/2016 12:48:56 PM   Radiology Dg Chest 2 View  Result Date: 11/30/2016 CLINICAL DATA:  Cough, fever, tachycardia.  Nonsmoker. EXAM: CHEST  2 VIEW COMPARISON:  Report only of a chest x-ray of November 12, 2000 FINDINGS: The lungs are well-expanded. There is no focal infiltrate. There is subtle density that projects in the retro sternal region likely in the lingula that may be acute or old. There is bilateral mild apical pleural thickening. There is no pleural effusion. The heart and pulmonary vascularity are normal. The mediastinum is normal in width. There is no pleural effusion. The bony thorax is unremarkable. IMPRESSION: Possible lingular atelectasis or early pneumonia. Otherwise there no active cardiopulmonary disease. Given the patient's symptoms and lack of previous images to review, followup PA and lateral chest X-ray is recommended in 3-4 weeks following trial of antibiotic therapy to ensure resolution and exclude underlying malignancy. Electronically Signed   By: David  Martinique M.D.   On: 11/30/2016 11:14    Procedures Procedures (including critical care time)  Medications Ordered in ED Medications  albuterol (PROVENTIL HFA;VENTOLIN HFA) 108 (90 Base) MCG/ACT inhaler 1-2 puff (not administered)  albuterol (PROVENTIL) (2.5 MG/3ML) 0.083% nebulizer solution 5 mg (5 mg Nebulization Given 11/30/16 1412)  ipratropium (ATROVENT) nebulizer solution 0.5 mg (0.5 mg Nebulization Given 11/30/16 1413)     Initial Impression / Assessment and Plan / ED Course  I have reviewed the triage vital signs and the nursing notes.  Pertinent labs & imaging results that were available during my care of the patient were reviewed by me and considered in my medical decision making (see chart for  details).  Clinical Course  CHA2DS2/VAS Stroke Risk Points      1 >= 2 Points: High Risk  1 - 1.99 Points: Medium Risk  0 Points: Low Risk    The patient's score has not changed in the past year.:  No Change   Details    Note: External data might be a factor in metrics not marked with    Points Metrics   This score determines the patient's risk of having a stroke if the  patient has atrial fibrillation.       0 Has Congestive Heart Failure:  No   0 Has Vascular Disease:  No   0 Has Hypertension:  No   0 Age:  81   0 Has Diabetes:  No   0 Had Stroke:  No Had TIA:  No Had thromboembolism:  No   1 Female:  Yes         70 y.o. female here with new onset paroxysmal afib associated with diagnosis of +flu and +L lingula PNA, seen at [facility] prior to arrival and sent here due to the Afib concern. Had CBC w/diff which was unremarkable, +flu test, +CXR, and EKG which showed irregularly irregular rhythm. Initial EKG here showed Afib with RVR but during exam pt's HR is 84bpm and in reg rhythm; will repeat EKG now. Lung sounds rhonchorous especially in the LLL, will give duoneb now. Will get BMP and troponin as well. CHADSVASC2 score low (depends on which calculator is used whether she gets 1 point or 2 points), thus doesn't seem that she definitely needs anticoagulation, but since she's converted on her own we don't need to pursue any further treatment to convert pt or emergent decision on anticoagulation; will need to have that discussion with her cardiologist, since this is paroxysmal and with questionable CHADSVASC score. Overall, assuming trop and BMP are unremarkable, she will likely just need to f/up with cardiology, and be treated for her flu/PNA (Rx's for Zpak/doxy/tamiflu already called in by [facility] provider Alicia Fendt PA-C). Discussed case with my attending Dr. Regenia Skeeter who agrees with plan. Will reassess after labs/repeat EKG  2:59 PM Lung sounds greatly improved after  breathing tx, will send home with inhaler and rx for one. Trop negative. Repeat EKG shows resolved Afib and NSR now present, no acute findings. BMP unremarkable. Will send home with inhaler. Other Rx's already waiting for her at the pharmacy, discussed proper use of those as well. F/up with [facility] in 3 days for recheck, and with her cardiologist in 1wk for re-evaluation of her paroxysmal afib and discussion of anticoagulation. Discussed OTC meds for symptom control as well. I explained the diagnosis and have given explicit precautions to return to the ER including for any other new or worsening symptoms. The patient understands and accepts the medical plan as it's been dictated and I have answered their questions. Discharge instructions concerning home care and prescriptions have been given. The patient is STABLE and is discharged to home in good condition.   Final Clinical Impressions(s) / ED Diagnoses   Final diagnoses:  Influenza  Community acquired pneumonia of left lower lobe of lung (HCC)  Paroxysmal atrial fibrillation (HCC)    New Prescriptions New Prescriptions   ALBUTEROL (PROVENTIL HFA;VENTOLIN HFA) 108 (90 BASE) MCG/ACT INHALER    Inhale 2 puffs into the lungs every 4 (four) hours as needed for wheezing or shortness of breath (cough).     Rachard Isidro Camprubi-Soms, PA-C 11/30/16 1500    Sherwood Gambler, MD 11/30/16 [phone]

## 2016-11-30 NOTE — Telephone Encounter (Signed)
I called the pt to make sure she contacted her Cardiologist or offer appt to afib clinic.  She stated she would call Dr. Griffin Dakin office to sched.

## 2016-11-30 NOTE — Progress Notes (Signed)
Iv started in right wrist 20g 1000ns by karen koller rn

## 2016-11-30 NOTE — ED Notes (Signed)
ED Provider at bedside. 

## 2016-11-30 NOTE — Discharge Instructions (Signed)
Continue to stay well-hydrated. Continue to alternate between Tylenol and Ibuprofen for pain or fever. Use Mucinex for cough suppression/expectoration of mucus. Use netipot and flonase to help with nasal congestion. May consider over-the-counter Benadryl or other antihistamine to decrease secretions and for help with your symptoms. Use inhaler as directed, as needed for cough/chest congestion/wheezing. Take the antibiotics and tamiflu as directed by the urgent care provider (those prescriptions were called into your pharmacy). Follow up with your primary care doctor at [facility] urgent care in 3 days for recheck of your symptoms and illness, and follow up with your cardiologist in 1 week for re-evaluation of your occasional atrial fibrillation. Return to emergency department for emergent changing or worsening of symptoms.

## 2016-11-30 NOTE — ED Notes (Signed)
Pt is in stable condition upon d/c and ambulates from ED. 

## 2016-11-30 NOTE — Patient Instructions (Signed)
     IF you received an x-ray today, you will receive an invoice from [facility]. Please contact  Radiology at [phone] with questions or concerns regarding your invoice.   IF you received labwork today, you will receive an invoice from [facility]. Please contact [facility] at [phone] with questions or concerns regarding your invoice.   Our billing staff will not be able to assist you with questions regarding bills from these companies.  You will be contacted with the lab results as soon as they are available. The fastest way to get your results is to activate your My Chart account. Instructions are located on the last page of this paperwork. If you have not heard from us regarding the results in 2 weeks, please contact this office.      

## 2016-12-01 ENCOUNTER — Telehealth: Payer: Self-pay | Admitting: Emergency Medicine

## 2016-12-01 LAB — BASIC METABOLIC PANEL
BUN/Creatinine Ratio: 21 (ref 12–28)
BUN: 19 mg/dL (ref 8–27)
CALCIUM: 9.1 mg/dL (ref 8.7–10.3)
CHLORIDE: 96 mmol/L (ref 96–106)
CO2: 22 mmol/L (ref 18–29)
Creatinine, Ser: 0.89 mg/dL (ref 0.57–1.00)
GFR calc non Af Amer: 67 mL/min/{1.73_m2} (ref >59)
GFR, EST AFRICAN AMERICAN: 77 mL/min/{1.73_m2} (ref >59)
Glucose: 101 mg/dL — ABNORMAL HIGH (ref 65–99)
POTASSIUM: 4.5 mmol/L (ref 3.5–5.2)
SODIUM: 138 mmol/L (ref 134–144)

## 2016-12-01 NOTE — Telephone Encounter (Signed)
Patient is returning a missed phone call from Mr. Carlis Abbott.  [phone]

## 2016-12-06 ENCOUNTER — Ambulatory Visit (INDEPENDENT_AMBULATORY_CARE_PROVIDER_SITE_OTHER): Payer: Medicare Other | Admitting: Physician Assistant

## 2016-12-06 VITALS — BP 124/74 | HR 67 | Temp 98.3°F | Resp 17 | Ht 63.5 in | Wt 139.0 lb

## 2016-12-06 DIAGNOSIS — I480 Paroxysmal atrial fibrillation: Secondary | ICD-10-CM

## 2016-12-06 DIAGNOSIS — J189 Pneumonia, unspecified organism: Secondary | ICD-10-CM | POA: Diagnosis not present

## 2016-12-06 DIAGNOSIS — J09X2 Influenza due to identified novel influenza A virus with other respiratory manifestations: Secondary | ICD-10-CM

## 2016-12-06 NOTE — Progress Notes (Signed)
12/06/2016 9:57 AM   DOB: Mar 31, 1946 / MRN: [id number]  SUBJECTIVE:  Alicia Bass is a 70 y.o. female presenting for follow up of lingular pneumonia along with influenza A.  She did get the annual flu shot. She was also in in afib last I saw her.  She was sent to the ED and the afib cleared and she was sent home.  She has been taking doxy/azithro and tamiflu and has finished all but doxy.  She feels 100% better today and feels close to her normal today.   She is allergic to asa [aspirin]; codeine; and midol [aspirin-cinnamedrine-caffeine].   She  has a past medical history of Allergy; Arthritis; Basal cell carcinoma; Fibromyalgia; Hiatal hernia; and Shingles (2007).    She  reports that she has never smoked. She has never used smokeless tobacco. She reports that she drinks alcohol. She reports that she does not use drugs. She  reports that she currently engages in sexual activity and has had female partners. She reports using the following method of birth control/protection: Surgical. The patient  has a past surgical history that includes Abdominal hysterectomy; Cholecystectomy; and excision.  Her family history includes Cancer in her father, maternal uncle, and sister; Diabetes in her sister; Early death in her son; Heart disease in her brother, mother, sister, and sister; Kidney disease in her paternal aunt; Mental illness (age of onset: 79) in her son; Stroke in her sister.  Review of Systems  Constitutional: Negative for fever.  Respiratory: Positive for cough. Negative for sputum production and shortness of breath.   Cardiovascular: Negative for chest pain and leg swelling.  Neurological: Negative for dizziness.    The problem list and medications were reviewed and updated by myself where necessary and exist elsewhere in the encounter.   OBJECTIVE:  BP 124/74 (BP Location: Right Arm, Patient Position: Sitting, Cuff Size: Normal)   Pulse 67   Temp 98.3 F (36.8 C) (Oral)   Resp  17   Ht 5' 3.5" (1.613 m)   Wt 139 lb (63 kg)   SpO2 97%   BMI 24.24 kg/m   Physical Exam  Constitutional: She is oriented to person, place, and time. She appears well-nourished. No distress.  Eyes: EOM are normal. Pupils are equal, round, and reactive to light.  Cardiovascular: Normal rate, regular rhythm and normal heart sounds.   No murmur heard. Pulmonary/Chest: Effort normal. No respiratory distress. She has no wheezes. She has no rales. She exhibits no tenderness.  Abdominal: She exhibits no distension.  Musculoskeletal: She exhibits no edema.  Neurological: She is alert and oriented to person, place, and time. No cranial nerve deficit. Gait normal.  Skin: Skin is dry. She is not diaphoretic.  Psychiatric: She has a normal mood and affect.  Vitals reviewed.   No results found for this or any previous visit (from the past 72 hour(s)).  No results found.  ASSESSMENT AND PLAN  [name] was seen today for follow-up.  Diagnoses and all orders for this visit:  Lingular pneumonia Comments: Lung sounds clear today.  No tachy, SOB, chest pain.  She is continued on doxy to cover for a staph pneumonia with 4 days left. She has completed macrolide.   Influenza due to identified novel influenza A virus with other respiratory manifestations Comments: Resolved. Tamiflu completed.   Paroxysmal atrial fibrillation (HCC) Comments: Resolved a the at the ED same day it was diagnosed.  No medication changes at that time.     The  patient is advised to call or return to clinic if she does not see an improvement in symptoms, or to seek the care of the closest emergency department if she worsens with the above plan.   Philis Fendt, MHS, PA-C [facility] 12/06/2016 9:57 AM

## 2016-12-06 NOTE — Patient Instructions (Signed)
     IF you received an x-ray today, you will receive an invoice from [facility]. Please contact [facility] at [phone] with questions or concerns regarding your invoice.   IF you received labwork today, you will receive an invoice from [facility]. Please contact [facility] at [phone] with questions or concerns regarding your invoice.   Our billing staff will not be able to assist you with questions regarding bills from these companies.  You will be contacted with the lab results as soon as they are available. The fastest way to get your results is to activate your My Chart account. Instructions are located on the last page of this paperwork. If you have not heard from us regarding the results in 2 weeks, please contact this office.      

## 2016-12-07 ENCOUNTER — Ambulatory Visit (INDEPENDENT_AMBULATORY_CARE_PROVIDER_SITE_OTHER): Payer: Medicare Other | Admitting: Physician Assistant

## 2016-12-07 ENCOUNTER — Encounter: Payer: Self-pay | Admitting: Physician Assistant

## 2016-12-07 VITALS — BP 130/80 | HR 70 | Temp 97.7°F | Resp 16 | Ht 62.5 in | Wt 139.2 lb

## 2016-12-07 DIAGNOSIS — I4891 Unspecified atrial fibrillation: Secondary | ICD-10-CM

## 2016-12-07 DIAGNOSIS — Z0000 Encounter for general adult medical examination without abnormal findings: Secondary | ICD-10-CM

## 2016-12-07 DIAGNOSIS — N8110 Cystocele, unspecified: Secondary | ICD-10-CM | POA: Diagnosis not present

## 2016-12-07 NOTE — Patient Instructions (Addendum)
Dr. Victorino December office will call you to schedule follow-up there.    IF you received an x-ray today, you will receive an invoice from [facility]. Please contact [facility] at [phone] with questions or concerns regarding your invoice.   IF you received labwork today, you will receive an invoice from [employer]. Please contact [facility] at [phone] with questions or concerns regarding your invoice.   Our billing staff will not be able to assist you with questions regarding bills from these companies.  You will be contacted with the lab results as soon as they are available. The fastest way to get your results is to activate your My Chart account. Instructions are located on the last page of this paperwork. If you have not heard from [place] regarding the results in 2 weeks, please contact this office.    Keeping You Healthy  Get These Tests  Blood Pressure- Have your blood pressure checked by your healthcare provider at least once a year.  Normal blood pressure is 120/80.  Weight- Have your body mass index (BMI) calculated to screen for obesity.  BMI is a measure of body fat based on height and weight.  You can calculate your own BMI at [employer].it  Cholesterol- Have your cholesterol checked every year.  Diabetes- Have your blood sugar checked every year if you have high blood pressure, high cholesterol, a family history of diabetes or if you are overweight.  Pap Test - Have a pap test every 1 to 5 years if you have been sexually active.  If you are older than 65 and recent pap tests have been normal you may not need additional pap tests.  In addition, if you have had a hysterectomy  for benign disease additional pap tests are not necessary.  Mammogram-Yearly mammograms are essential for early detection of breast cancer  Screening for Colon Cancer- Colonoscopy starting at age 61. Screening may begin sooner depending on your  family history and other health conditions.  Follow up colonoscopy as directed by your Gastroenterologist.  Screening for Osteoporosis- Screening begins at age 55 with bone density scanning, sooner if you are at higher risk for developing Osteoporosis.  Get these medicines  Calcium with Vitamin D- Your body requires 1200-1500 mg of Calcium a day and [phone] IU of Vitamin D a day.  You can only absorb 500 mg of Calcium at a time therefore Calcium must be taken in 2 or 3 separate doses throughout the day.  Hormones- Hormone therapy has been associated with increased risk for certain cancers and heart disease.  Talk to your healthcare provider about if you need relief from menopausal symptoms.  Aspirin- Ask your healthcare provider about taking Aspirin to prevent Heart Disease and Stroke.  Get these Immuniztions  Flu shot- Every fall  Pneumonia shot- Once after the age of 18; if you are younger ask your healthcare provider if you need a pneumonia shot.  Tetanus- Every ten years.  Zostavax- Once after the age of 94 to prevent shingles.  Take these steps  Don't smoke- Your healthcare provider can help you quit. For tips on how to quit, ask your healthcare provider or go to www.smokefree.gov or call 1-800 QUIT-NOW.  Be physically active- Exercise 5 days a week for a minimum of 30 minutes.  If you are not already physically active, start slow and gradually work up to 30 minutes of moderate physical activity.  Try walking, dancing, bike riding, swimming, etc.  Eat a healthy diet- Eat  a variety of healthy foods such as fruits, vegetables, whole grains, low fat milk, low fat cheeses, yogurt, lean meats, chicken, fish, eggs, dried beans, tofu, etc.  For more information go to www.thenutritionsource.org  Dental visit- Brush and floss teeth twice daily; visit your dentist twice a year.  Eye exam- Visit your Optometrist or Ophthalmologist yearly.  Drink alcohol in moderation- Limit alcohol  intake to one drink or less a day.  Never drink and drive.  Depression- Your emotional health is as important as your physical health.  If you're feeling down or losing interest in things you normally enjoy, please talk to your healthcare provider.  Seat Belts- can save your life; always wear one  Smoke/Carbon Monoxide detectors- These detectors need to be installed on the appropriate level of your home.  Replace batteries at least once a year.  Violence- If anyone is threatening or hurting you, please tell your healthcare provider.  Living Will/ Health care power of attorney- Discuss with your healthcare provider and family.

## 2016-12-07 NOTE — Progress Notes (Signed)
Presents today for [employer] Visit-Subsequent.   Date of last exam: 12/24/2015  Interpreter used for this visit? no  Patient Care Team: Harrison Mons, PA-C as PCP - General (Family Medicine) Sanda Klein, MD as Consulting Physician (Cardiology)   Other items to address today: recent pneumonia, new Afib, cystocele Seen here 11/30/2016 with cough. Diagnosed with influenza, lingular pneumonia and new onset Afib. She was evaluated in the ED. She spontaneously converted to NSR while in the ED. Is to follow-up with cardiology, but hasn't scheduled yet. "Falling bladder" becoming more of a problem. Sometimes has to use her fingers to insert her bladder back in order to urinate. She's been reluctant to seek specialty evaluation due to her sister having to have bladder tack x 3. Recently retired and won't be on her feet as much, hopes that will reduce how problematic it is.  Cancer Screening: Cervical: n/a, s/p hysterectomy for non-malignancy Breast: yes, 02/03/2016, normal Colon: yes, 11/2012 one small polyp, diverticulosis, internal/external hemorrhoids  Prostate: n/a   Other Screening: Last screening for diabetes: 11/2015. 100 Last lipid screening: 11/2013, TC 161, LDL 97, TG 90, HDL 46   ADVANCE DIRECTIVES: Discussed: yes On File: no Materials Provided: yes   Immunization status: up to date and documented.  Home Environment: Lives in a single story home with her husband, her mother, her daughter and one granddaughter.    Patient Active Problem List   Diagnosis Date Noted  . Pterygium eye 12/22/2014  . Abnormal resting ECG findings 01/08/2014  . Microscopic hematuria 12/17/2013  . Fibromyalgia 12/12/2012  . Bladder cystocele 12/12/2012  . Osteoporosis 12/12/2012  . Hiatal hernia      Past Medical History:  Diagnosis Date  . Allergy   . Arthritis   . Basal cell carcinoma    LEFT anterior chest wall  . Fibromyalgia   . Hiatal hernia   . Shingles  2007     Past Surgical History:  Procedure Laterality Date  . ABDOMINAL HYSTERECTOMY    . CHOLECYSTECTOMY    . excision     BCC, back     Family History  Problem Relation Age of Onset  . Heart disease Mother   . Cancer Father   . Cancer Sister     "full blown cancer," "it started as a squeamish on her head"  . Heart disease Sister   . Diabetes Sister   . Heart disease Brother   . Early death Son   . Mental illness Son 93    bipolar disorder  . Cancer Maternal Uncle   . Kidney disease Paternal Aunt   . Stroke Sister   . Heart disease Sister      Social History   Social History  . Marital status: Married    Spouse name: Evalyse Tackitt  . Number of children: 5  . Years of education: 12   Occupational History  . 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 Smoking status: Never Smoker  . Smokeless tobacco: Never Used  . Alcohol use Yes     Comment: 1-2 annually  . Drug use: No  . Sexual activity: Yes    Partners: Female    Birth control/ protection: Surgical   Other Topics Concern  . Not on file   Social History Narrative   Lives with husband who was diagnosed with Parkinson's in 2014.  Upon her retirement  11/2016, she moved her mother into their home. Square dances, bowls and rides motorcycles with her husband. Her daughter and 2 grandchildren live with them.  Her son died by suicide at age 65.  Her husband also has 3 children from a previous relationship.     Allergies  Allergen Reactions  . Asa [Aspirin] Other (See Comments)    "a whole big aspirin sets off my hiatal hernia." Tolerates low-dose.  . Codeine   . Midol [Aspirin-Cinnamedrine-Caffeine] Palpitations     Prior to Admission medications   Medication Sig Start Date End Date Taking? Authorizing Provider  aspirin 81 MG tablet Take 81 mg by mouth daily.    Historical Provider, MD  Cholecalciferol (VITAMIN D PO) Take by mouth  daily.    Historical Provider, MD  doxycycline (VIBRAMYCIN) 100 MG capsule Take 1 capsule (100 mg total) by mouth 2 (two) times daily. 11/30/16 12/10/16  Tereasa Coop, PA-C  glucosamine-chondroitin 500-400 MG tablet Take 1 tablet by mouth 3 (three) times daily.    Historical Provider, MD  Multiple Vitamin (MULTIVITAMIN) tablet Take 1 tablet by mouth daily.    Historical Provider, MD  OVER THE COUNTER MEDICATION OTC Ginge Root 550 mg taking everyday    Historical Provider, MD  OVER THE COUNTER MEDICATION OTC Cinnamon 1000 mg taking everyday    Historical Provider, MD  OVER THE COUNTER MEDICATION OTC Acid reflux med taking    Historical Provider, MD     Depression screen [facility] 2/9 12/07/2016 12/06/2016 11/30/2016 12/24/2015 11/27/2013  Decreased Interest 0 0 0 0 0  Down, Depressed, Hopeless 0 0 0 0 0  PHQ - 2 Score 0 0 0 0 0     Fall Risk  12/07/2016 12/06/2016 11/30/2016 11/27/2013  Falls in the past year? No No No No     Functional Status Survey: Is the patient deaf or have difficulty hearing?: No Does the patient have difficulty seeing, even when wearing glasses/contacts?: No Does the patient have difficulty concentrating, remembering, or making decisions?: No Does the patient have difficulty walking or climbing stairs?: No Does the patient have difficulty dressing or bathing?: No Does the patient have difficulty doing errands alone such as visiting a doctor's office or shopping?: No     Evaluation of Cognitive Function: Mood/affect: cheerful, bright Appearance: well-groomed Family Member/caregiver input: none today, patient is unaccompanied    PHYSICAL EXAM: BP 130/80   Pulse 70   Temp 97.7 F (36.5 C) (Oral)   Resp 16   Ht 5' 2.5" (1.588 m)   Wt 139 lb 3.2 oz (63.1 kg)   SpO2 99%   BMI 25.05 kg/m    Wt Readings from Last 3 Encounters:  12/07/16 139 lb 3.2 oz (63.1 kg)  12/06/16 139 lb (63 kg)  11/30/16 138 lb 14.2 oz (63 kg)       Visual Acuity Screening    Right eye Left eye Both eyes  Without correction:     With correction: 20/30 20/30 20/20       Physical Exam  Constitutional: She is oriented to person, place, and time. She appears well-developed and well-nourished. She is active and cooperative. No distress.  HENT:  Head: Normocephalic and atraumatic.  Right Ear: Hearing, tympanic membrane, external ear and ear canal normal.  Left Ear: Hearing, tympanic membrane, external ear and ear canal normal.  Nose: Nose normal.  Mouth/Throat: Uvula is midline, oropharynx is clear and moist and mucous membranes are normal. No oral lesions. No uvula swelling. No oropharyngeal  exudate.  Eyes: Conjunctivae, EOM and lids are normal. Pupils are equal, round, and reactive to light. Right eye exhibits no discharge. Left eye exhibits no discharge. No scleral icterus.  Fundoscopic exam:      The right eye shows no hemorrhage and no papilledema. The right eye shows red reflex.       The left eye shows no hemorrhage and no papilledema. The left eye shows red reflex.  Neck: Normal range of motion, full passive range of motion without pain and phonation normal. Neck supple. No thyromegaly present.  Cardiovascular: Normal rate, regular rhythm, normal heart sounds and intact distal pulses.  Exam reveals no gallop and no friction rub.   No murmur heard. Respiratory: Effort normal and breath sounds normal. Right breast exhibits no inverted nipple, no mass, no nipple discharge, no skin change and no tenderness. Left breast exhibits no inverted nipple, no mass, no nipple discharge, no skin change and no tenderness. Breasts are symmetrical.  GI: Soft. Normal appearance and bowel sounds are normal. There is no hepatosplenomegaly. There is no tenderness.  Musculoskeletal:       Cervical back: Normal.       Thoracic back: Normal.       Lumbar back: Normal.  Lymphadenopathy:       Head (right side): No submandibular and no tonsillar adenopathy present.       Head (left  side): No submandibular and no tonsillar adenopathy present.    She has no cervical adenopathy.       Right: No supraclavicular adenopathy present.       Left: No supraclavicular adenopathy present.  Neurological: She is alert and oriented to person, place, and time. She has normal strength. No cranial nerve deficit or sensory deficit.  Skin: Skin is warm and dry. No rash noted. She is not diaphoretic.  Psychiatric: She has a normal mood and affect. Her speech is normal and behavior is normal. Judgment and thought content normal. Cognition and memory are normal.     Education/Counseling: yes diet and exercise yes prevention of chronic diseases n/a smoking/tobacco cessation yes review "Covered Medicare Preventive Services"    ASSESSMENT/PLAN:  1. Medicare annual wellness visit, subsequent Age appropriate anticipatory guidance provided.  2. Atrial fibrillation, unspecified type (HCC) Regular rate and rhythm today. Follow-up with Dr. Sallyanne Kuster. - Ambulatory referral to Cardiology  3. Female cystocele She'll let me know when she is ready to see urology.   Fara Chute, PA-C Physician Assistant-Certified [facility] Iyonna Rish, PA-C Physician Assistant-Certified [facility]

## 2016-12-17 ENCOUNTER — Ambulatory Visit (INDEPENDENT_AMBULATORY_CARE_PROVIDER_SITE_OTHER): Payer: Medicare Other | Admitting: Physician Assistant

## 2016-12-17 ENCOUNTER — Encounter (INDEPENDENT_AMBULATORY_CARE_PROVIDER_SITE_OTHER): Payer: Medicare Other

## 2016-12-17 ENCOUNTER — Encounter: Payer: Self-pay | Admitting: Physician Assistant

## 2016-12-17 VITALS — BP 136/72 | HR 61 | Ht 62.5 in | Wt 140.4 lb

## 2016-12-17 DIAGNOSIS — I480 Paroxysmal atrial fibrillation: Secondary | ICD-10-CM

## 2016-12-17 NOTE — Patient Instructions (Addendum)
Your physician has recommended that you wear a 30 day event monitor. Event monitors are medical devices that record the heart's electrical activity. Doctors most often [place] these monitors to diagnose arrhythmias. Arrhythmias are problems with the speed or rhythm of the heartbeat. The monitor is a small, portable device. You can wear one while you do your normal daily activities. This is usually used to diagnose what is causing palpitations/syncope (passing out).  Your physician recommends that you return for lab work next week.  Your physician recommends that you schedule a follow-up appointment in: 2 months with Dr Sallyanne Kuster.

## 2016-12-17 NOTE — Progress Notes (Signed)
Cardiology Office Note    Date:  12/18/2016   ID:  Alicia Bass, DOB 03-23-1946, MRN [id number]  PCP:  Alicia Mons, PA-C  Cardiologist:  Dr. Sallyanne Bass  Chief Complaint  Patient presents with  . Follow-up    pt states she had the flu and pneumonia 2 weeks ago--went into AFIB. Pt states she felt a fast heart beat once since then.     History of Present Illness:  Alicia Bass is a 70 y.o. female with PMH of basal cell carcinoma, fibromyalgia and hiatal hernia. She was previously evaluated for palpitations and shortness of breath. She was under a lot of emotional stress at the time with numerous family member that were ill or dying. After her social pressure gradually improved, her symptom also completely resolved. She had a normal echocardiogram in 2015. No further workup was recommended at the time and the she was instructed to follow-up as needed. He was seen for influenza on 11/30/2016, she did have a positive flu test, however found to be in atrial fibrillation with RVR by urgent care. She was subsequently sent to the emergency room for further evaluation. While in the emergency room, she spontaneously converted to normal sinus rhythm. She was also tested positive for influenza A. She was treated with antibiotic for potential pneumonia as well. Since then, she has been recovering quite well, she still have a little cough however significantly improved compared to when she sought medical attention earlier this month.  EKG today shows sinus rhythm with single PAC. She is currently not on any rate control medication. Her heart rate in the office is 61 bpm. She did have palpitation 2 days ago however was not sure if this related to anxiety all tissue go back into atrial fibrillation. She says she has been under some stress lately because her husband has Alzheimer's disease. I recommended start carvedilol 3.125 mg twice a day. Given her heart rate of 61, I would not recommend started on a  strong rate control medication. I think she would be able to tolerate carvedilol 3.125 mg twice a day however patient wished to discuss with Dr. Sallyanne Bass first. Secondly, question arises how frequently she has atrial fibrillation. She clearly had atrial fibrillation recently because of influenza. However she also has intermittent palpitations since. She had a hard time telling me whether her most recent palpitation is what she experienced when she was in atrial fibrillation. She says she has been under a lot of stress at home. I will hold off on starting on systemic anticoagulation at this time. I did put her on a 30 day event monitor. We discussed the benefit and risk and differences of traditional Coumadin versus eliquis and Xarelto. If 30 day event monitor does show she has recurrent atrial fibrillation, I would recommend starting low-dose carvedilol and also put her Xarelto 20 mg daily. Otherwise she does not have any obvious contraindication to systemic anticoagulation, she did require blood transfusion after her partial colectomy years ago, otherwise she denies any bleeding issue or fall risk.   Past Medical History:  Diagnosis Date  . Allergy   . Arthritis   . Basal cell carcinoma    LEFT anterior chest wall  . Fibromyalgia   . Hiatal hernia   . Shingles 2007    Past Surgical History:  Procedure Laterality Date  . ABDOMINAL HYSTERECTOMY    . CHOLECYSTECTOMY    . excision     BCC, back    Current Medications: Outpatient  Medications Prior to Visit  Medication Sig Dispense Refill  . aspirin 81 MG tablet Take 81 mg by mouth daily.    . Cholecalciferol (VITAMIN D PO) Take by mouth daily.    Marland Kitchen glucosamine-chondroitin 500-400 MG tablet Take 1 tablet by mouth 3 (three) times daily.    . Multiple Vitamin (MULTIVITAMIN) tablet Take 1 tablet by mouth daily.    Marland Kitchen OVER THE COUNTER MEDICATION OTC Ginge Root 550 mg taking everyday    . OVER THE COUNTER MEDICATION OTC Cinnamon 1000 mg taking  everyday    . OVER THE COUNTER MEDICATION OTC Acid reflux med taking     No facility-administered medications prior to visit.      Allergies:   Asa [aspirin]; Codeine; and Midol [aspirin-cinnamedrine-caffeine]   Social History   Social History  . Marital status: Married    Spouse name: Alicia Bass  . Number of children: 5  . Years of education: 12   Occupational History  . 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 Smoking status: Never Smoker  . Smokeless tobacco: Never Used  . Alcohol use Yes     Comment: 1-2 annually  . Drug use: No  . Sexual activity: Yes    Partners: Female    Birth control/ protection: Surgical   Other Topics Concern  . None   Social History Narrative   Lives with husband who was diagnosed with Parkinson's in 2014.  Upon her retirement 11/2016, she moved her mother into their home. Square dances, bowls and rides motorcycles with her husband. Her daughter and 2 grandchildren live with them.  Her son died by suicide at age 97.  Her husband also has 3 children from a previous relationship.     Family History:  The patient's family history includes Cancer in her father, maternal uncle, and sister; Diabetes in her sister; Early death in her son; Heart disease in her brother, mother, sister, and sister; Hyperlipidemia in her mother; Kidney disease in her paternal aunt; Mental illness (age of onset: 65) in her son; Stroke in her brother, mother, and sister.   ROS:   Please see the history of present illness.    ROS All other systems reviewed and are negative.   PHYSICAL EXAM:   VS:  BP 136/72 (BP Location: Left Arm, Patient Position: Sitting, Cuff Size: Normal)   Pulse 61   Ht 5' 2.5" (1.588 m)   Wt 140 lb 6.4 oz (63.7 kg)   BMI 25.27 kg/m    GEN: Well nourished, well developed, in no acute distress  HEENT: normal  Neck: no JVD, carotid bruits, or masses Cardiac: RRR; no murmurs,  rubs, or gallops,no edema  Respiratory:  clear to auscultation bilaterally, normal work of breathing GI: soft, nontender, nondistended, + BS MS: no deformity or atrophy  Skin: warm and dry, no rash Neuro:  Alert and Oriented x 3, Strength and sensation are intact Psych: euthymic mood, full affect  Wt Readings from Last 3 Encounters:  12/17/16 140 lb 6.4 oz (63.7 kg)  12/07/16 139 lb 3.2 oz (63.1 kg)  12/06/16 139 lb (63 kg)      Studies/Labs Reviewed:   EKG:  EKG is ordered today.  The ekg ordered today demonstrates NSR with HR 61, single PAC, no significant ST-T wave changes  Recent Labs: 12/24/2015: ALT 19; Platelets 247; TSH 2.003 11/30/2016: BUN 15; Creatinine, Ser  0.89; Hemoglobin 15.5; Potassium 3.8; Sodium 138   Lipid Panel    Component Value Date/Time   CHOL 161 11/27/2013 1051   TRIG 90 11/27/2013 1051   HDL 46 11/27/2013 1051   CHOLHDL 3.5 11/27/2013 1051   VLDL 18 11/27/2013 1051   LDLCALC 97 11/27/2013 1051    Additional studies/ records that were reviewed today include:   Echo 01/28/2014 LV EF: 55% -  60%  - Left ventricle: The cavity size was normal. Systolic function was normal. The estimated ejection fraction was in the range of 55% to 60%. Wall motion was normal; there were no regional wall motion abnormalities. Left ventricular diastolic function parameters were normal. - Mitral valve: Valve area by pressure half-time: 1.88cm^2.    ASSESSMENT:    1. Paroxysmal atrial fibrillation (HCC)      PLAN:  In order of problems listed above:  1. Paroxysmal atrial fibrillation in the setting of pneumonia and influenza a - She has long-standing history of palpitation, however just recently diagnosed with toxic small atrial fibrillation when she went to the urgent care for the influenza and was diagnosed to be in atrial fibrillation with RVR. She converted in the ED. She currently does not have any rate control medication. - Today's EKG shows her  heart rate is 61 bpm, she still has occasional palpitation and she did have a PAC on today's EKG as well. I did recommend a low-dose 3.125 mg twice a day of carvedilol. I would not recommend any strong rate control medication as her heart rate is currently on the lower side. - This patients CHA2DS2-VASc Score and unadjusted Ischemic Stroke Rate (% per year) is equal to 2.2 % stroke rate/year from a score of 2  Above score calculated as 1 point each if present [CHF, HTN, DM, Vascular=MI/PAD/Aortic Plaque, Age if 65-74, or Female] Above score calculated as 2 points each if present [Age > 75, or Stroke/TIA/TE] - Question also arises at this time whether or not she should be placed on systemic anticoagulation, this is really depend on whether her recent atrial fibrillation and is only due to influenza and pneumonia. I think the recent stressful illness likely pushed her into atrial fibrillation. However given the recurrent palpitations since, I recommended a 30 day monitor. If she does have recurrent atrial fibrillation, I would recommend starting on Xarelto 20 mg daily. I did discuss with the patient the pathophysiology behind onset of atrial fibrillation, the stroke risk and the need for rate control. I also discussed with her benefit and risk of traditional Coumadin versus NOAC including Xarelto and adequate.     Medication Adjustments/Labs and Tests Ordered: Current medicines are reviewed at length with the patient today.  Concerns regarding medicines are outlined above.  Medication changes, Labs and Tests ordered today are listed in the Patient Instructions below. Patient Instructions  Your physician has recommended that you wear a 30 day event monitor. Event monitors are medical devices that record the heart's electrical activity. Doctors most often [place] these monitors to diagnose arrhythmias. Arrhythmias are problems with the speed or rhythm of the heartbeat. The monitor is a small, portable device.  You can wear one while you do your normal daily activities. This is usually used to diagnose what is causing palpitations/syncope (passing out).  Your physician recommends that you return for lab work next week.  Your physician recommends that you schedule a follow-up appointment in: 2 months with Dr Alicia Bass.       Signed, Almyra Deforest,  PA  12/18/2016 1:56 AM    [facility] [address], [place], [place]  [address] Phone: [phone]; Fax: [phone]

## 2016-12-18 ENCOUNTER — Encounter: Payer: Self-pay | Admitting: Physician Assistant

## 2016-12-22 ENCOUNTER — Telehealth: Payer: Self-pay | Admitting: Physician Assistant

## 2016-12-22 LAB — TSH: TSH: 1.85 mIU/L

## 2016-12-22 NOTE — Telephone Encounter (Signed)
I have discussed with Dr. Sallyanne Kuster, we'll hold off on initiating low-dose carvedilol and see how she does on the monitor. I have also called the patient, her TSH was normal. She has been informed of our recommendation and also her TSH result.  Hilbert Corrigan PA Pager: [phone]

## 2016-12-22 NOTE — Progress Notes (Signed)
I have called the patient myself and informed her the result

## 2016-12-27 DIAGNOSIS — J111 Influenza due to unidentified influenza virus with other respiratory manifestations: Secondary | ICD-10-CM

## 2016-12-27 DIAGNOSIS — I480 Paroxysmal atrial fibrillation: Secondary | ICD-10-CM

## 2016-12-27 HISTORY — DX: Paroxysmal atrial fibrillation: I48.0

## 2016-12-27 HISTORY — DX: Influenza due to unidentified influenza virus with other respiratory manifestations: J11.1

## 2017-01-03 NOTE — Addendum Note (Signed)
Addended by: Diana Eves on: 01/03/2017 02:02 PM   Modules accepted: Orders

## 2017-01-04 ENCOUNTER — Encounter: Payer: Self-pay | Admitting: Physician Assistant

## 2017-01-04 ENCOUNTER — Ambulatory Visit (INDEPENDENT_AMBULATORY_CARE_PROVIDER_SITE_OTHER): Payer: Medicare PPO

## 2017-01-04 ENCOUNTER — Ambulatory Visit (INDEPENDENT_AMBULATORY_CARE_PROVIDER_SITE_OTHER): Payer: Medicare PPO | Admitting: Physician Assistant

## 2017-01-04 VITALS — BP 130/74 | HR 69 | Temp 98.4°F | Resp 16 | Ht 62.5 in | Wt 142.6 lb

## 2017-01-04 DIAGNOSIS — J189 Pneumonia, unspecified organism: Secondary | ICD-10-CM

## 2017-01-04 NOTE — Patient Instructions (Addendum)
It looks like you turn in the monitor on 01/17/17.  I'll contact you with the results of the chest xray later today.    IF you received an x-ray today, you will receive an invoice from [facility]. Please contact [facility] at [phone] with questions or concerns regarding your invoice.   IF you received labwork today, you will receive an invoice from [facility]. Please contact [facility] at [phone] with questions or concerns regarding your invoice.   Our billing staff will not be able to assist you with questions regarding bills from these companies.  You will be contacted with the lab results as soon as they are available. The fastest way to get your results is to activate your My Chart account. Instructions are located on the last page of this paperwork. If you have not heard from [place] regarding the results in 2 weeks, please contact this office.

## 2017-01-04 NOTE — Progress Notes (Signed)
Patient ID: Alicia Bass, female    DOB: 01/27/1946, 71 y.o.   MRN: [id number]  PCP: Harrison Mons, PA-C  Chief Complaint  Patient presents with  . Follow-up    Pneumonia, pt states she is "better"    Subjective:   Presents for follow-up CXR following lingular pneumonia due to influenza early last month. At the time of diagnosis she was also diagnosed with atrial fibrillation, and has since followed up with cardiology. At her CPE 12/07/2016, she reported she felt well, back to baseline.  She remains well, without new or recurrent concerns.    Review of Systems  Constitutional: Negative.   HENT: Negative for sore throat.   Eyes: Negative for visual disturbance.  Respiratory: Negative for cough, chest tightness, shortness of breath and wheezing.   Cardiovascular: Negative for chest pain and palpitations.  Gastrointestinal: Negative for abdominal pain, diarrhea, nausea and vomiting.  Genitourinary: Negative for dysuria, frequency, hematuria and urgency.  Musculoskeletal: Negative for arthralgias and myalgias.  Skin: Negative for rash.  Neurological: Negative for dizziness, weakness and headaches.  Psychiatric/Behavioral: Negative for decreased concentration. The patient is not nervous/anxious.        Patient Active Problem List   Diagnosis Date Noted  . Pterygium eye 12/22/2014  . Abnormal resting ECG findings 01/08/2014  . Microscopic hematuria 12/17/2013  . Fibromyalgia 12/12/2012  . Female cystocele 12/12/2012  . Osteoporosis 12/12/2012  . Hiatal hernia      Prior to Admission medications   Medication Sig Start Date End Date Taking? Authorizing Provider  Ascorbic Acid (VITAMIN C PO) Take 1 tablet by mouth daily.    Historical Provider, MD  aspirin 81 MG tablet Take 81 mg by mouth daily.    Historical Provider, MD  Cholecalciferol (VITAMIN D PO) Take by mouth daily.    Historical Provider, MD  glucosamine-chondroitin 500-400 MG tablet Take 1 tablet by  mouth 3 (three) times daily.    Historical Provider, MD  Multiple Vitamin (MULTIVITAMIN) tablet Take 1 tablet by mouth daily.    Historical Provider, MD  OVER THE COUNTER MEDICATION OTC Ginge Root 550 mg taking everyday    Historical Provider, MD  OVER THE COUNTER MEDICATION OTC Cinnamon 1000 mg taking everyday    Historical Provider, MD  OVER THE COUNTER MEDICATION OTC Acid reflux med taking    Historical Provider, MD     Allergies  Allergen Reactions  . Asa [Aspirin] Other (See Comments)    "a whole big aspirin sets off my hiatal hernia." Tolerates low-dose.  . Codeine   . Midol [Aspirin-Cinnamedrine-Caffeine] Palpitations       Objective:  Physical Exam  Constitutional: She is oriented to person, place, and time. She appears well-developed and well-nourished. She is active and cooperative. No distress.  BP 130/74   Pulse 69   Temp 98.4 F (36.9 C) (Oral)   Resp 16   Ht 5' 2.5" (1.588 m)   Wt 142 lb 9.6 oz (64.7 kg)   SpO2 98%   BMI 25.67 kg/m   HENT:  Head: Normocephalic and atraumatic.  Right Ear: Hearing normal.  Left Ear: Hearing normal.  Eyes: Conjunctivae are normal. No scleral icterus.  Neck: Normal range of motion. Neck supple. No thyromegaly present.  Cardiovascular: Normal rate, regular rhythm and normal heart sounds.   Pulses:      Radial pulses are 2+ on the right side, and 2+ on the left side.  Pulmonary/Chest: Effort normal and breath sounds normal.  Lymphadenopathy:  Head (right side): No tonsillar, no preauricular, no posterior auricular and no occipital adenopathy present.       Head (left side): No tonsillar, no preauricular, no posterior auricular and no occipital adenopathy present.    She has no cervical adenopathy.       Right: No supraclavicular adenopathy present.       Left: No supraclavicular adenopathy present.  Neurological: She is alert and oriented to person, place, and time. No sensory deficit.  Skin: Skin is warm, dry and intact.  No rash noted. No cyanosis or erythema. Nails show no clubbing.  Psychiatric: She has a normal mood and affect. Her speech is normal and behavior is normal.           Assessment & Plan:   1. Lingular pneumonia Clinically resolved. Await CXR reading. - DG Chest 2 View; Future  Dg Chest 2 View  Result Date: 01/04/2017 CLINICAL DATA:  Hospitalized for influenza a 1 month ago. Follow-up study. Patient was recently diagnosed with atrial fibrillation but had spontaneous conversion to normal sinus rhythm. EXAM: CHEST  2 VIEW COMPARISON:  PA and lateral chest x-ray of November 30, 2016 FINDINGS: The lungs are adequately inflated and clear. The heart and pulmonary vascularity are normal. The mediastinum is normal in width. There is no pleural effusion. The bony thorax exhibits no acute abnormality. IMPRESSION: There is no active cardiopulmonary disease. Electronically Signed   By: David  Martinique M.D.   On: 01/04/2017 12:26    Fara Chute, PA-C Physician Assistant-Certified [facility]

## 2017-01-04 NOTE — Progress Notes (Signed)
     Patient ID: Alicia Bass, female    DOB: Jan 14, 1946, 71 y.o.   MRN: [id number]  PCP: Harrison Mons, PA-C  Chief Complaint  Patient presents with  . Follow-up    Pneumonia, pt states she is "better"    Subjective:   Presents for follow up for pneumonia.  Pt is a 71yo female who presents for follow up following hospitalization for pneumonia due to influenza A 4 weeks ago.  She was also diagnosed with atrial fibrillation at the time of her hospitalization, but converted back to regular rhythm and rate on her own. She has since followed up with a cardiologist and is wearing a 30-day cardiac monitor at her visit today. She states that she's feeling much better. She denies fever, chills, fatigue, cough, SOB, chest pain, palpitations, nausea, vomiting, or abdominal pain.   Review of Systems In addition to that stated in HPI above: Neuro: Denies headache or dizziness.     Patient Active Problem List   Diagnosis Date Noted  . Pterygium eye 12/22/2014  . Abnormal resting ECG findings 01/08/2014  . Microscopic hematuria 12/17/2013  . Fibromyalgia 12/12/2012  . Female cystocele 12/12/2012  . Osteoporosis 12/12/2012  . Hiatal hernia      Prior to Admission medications   Medication Sig Start Date End Date Taking? Authorizing Provider  Ascorbic Acid (VITAMIN C PO) Take 1 tablet by mouth daily.   Yes Historical Provider, MD  aspirin 81 MG tablet Take 81 mg by mouth daily.   Yes Historical Provider, MD  glucosamine-chondroitin 500-400 MG tablet Take 1 tablet by mouth 3 (three) times daily.   Yes Historical Provider, MD  Multiple Vitamin (MULTIVITAMIN) tablet Take 1 tablet by mouth daily.   Yes Historical Provider, MD  OVER THE COUNTER MEDICATION OTC Ginge Root 550 mg taking everyday   Yes Historical Provider, MD  OVER THE COUNTER MEDICATION OTC Cinnamon 1000 mg taking everyday   Yes Historical Provider, MD  OVER THE COUNTER MEDICATION OTC Acid reflux med taking   Yes Historical  Provider, MD  Cholecalciferol (VITAMIN D PO) Take by mouth daily.    Historical Provider, MD     Allergies  Allergen Reactions  . Asa [Aspirin] Other (See Comments)    "a whole big aspirin sets off my hiatal hernia." Tolerates low-dose.  . Codeine   . Midol [Aspirin-Cinnamedrine-Caffeine] Palpitations       Objective:  Physical Exam Pulm: Good respiratory effort. CTAB, no wheezes, rales, or rhonchi. CV: Cardiac monitor in place. RRR. No M/R/G.        Assessment & Plan:   1. Lingular pneumonia Pt appears to be completely recovered pending chest xray results. - DG Chest 2 View; Future   Lorella Nimrod, PA-S

## 2017-01-07 ENCOUNTER — Other Ambulatory Visit: Payer: Self-pay | Admitting: Physician Assistant

## 2017-01-07 DIAGNOSIS — Z1231 Encounter for screening mammogram for malignant neoplasm of breast: Secondary | ICD-10-CM

## 2017-02-03 ENCOUNTER — Ambulatory Visit
Admission: RE | Admit: 2017-02-03 | Discharge: 2017-02-03 | Disposition: A | Discharge status: Home / Self Care | Payer: Medicare PPO | Source: Ambulatory Visit | Attending: Physician Assistant | Admitting: Physician Assistant

## 2017-02-03 DIAGNOSIS — Z1231 Encounter for screening mammogram for malignant neoplasm of breast: Secondary | ICD-10-CM

## 2017-02-22 ENCOUNTER — Ambulatory Visit (INDEPENDENT_AMBULATORY_CARE_PROVIDER_SITE_OTHER): Payer: Medicare PPO | Admitting: Cardiovascular Disease

## 2017-02-22 ENCOUNTER — Encounter: Payer: Self-pay | Admitting: Cardiovascular Disease

## 2017-02-22 DIAGNOSIS — I480 Paroxysmal atrial fibrillation: Secondary | ICD-10-CM

## 2017-02-22 NOTE — Progress Notes (Signed)
Cardiology Office Note    Date:  02/23/2017   ID:  Alicia Bass, DOB February 24, 1946, MRN [id number]  PCP:  Alicia Mons, PA-C  Cardiologist:   Alicia Klein, MD   Chief Complaint  Patient presents with  . Follow-up    History of Present Illness:  Alicia Bass is a 71 y.o. female with a single episode of atrial fibrillation that occurred during an episode of influenza complicated by lingular pneumonia, returning for follow-up.   Her older sister Alicia Bass") also has atrial fibrillation and has had a previous stroke. Alicia Bass is also my patient  Her atrial fibrillation converted spontaneously to sinus rhythm while she was in the emergency room. She subsequently wore a 30 day event monitor that did not show atrial fibrillation. Alicia Bass has stopped taking the anticoagulant and is now taking aspirin alone. CHADSVasc score is 2 (age, gender), CHADS 1. As never had a stroke/TIA or other known embolic events. Otherwise very healthy, with her only other medical problems being a small hiatal hernia, fibromyalgia and basal cell carcinoma of the skin. Alicia Bass has a normal echocardiogram  Past Medical History:  Diagnosis Date  . Allergy   . Arthritis   . Basal cell carcinoma    LEFT anterior chest wall  . Fibromyalgia   . Hiatal hernia   . Shingles 2007    Past Surgical History:  Procedure Laterality Date  . ABDOMINAL HYSTERECTOMY    . CHOLECYSTECTOMY    . excision     BCC, back    Current Medications: Outpatient Medications Prior to Visit  Medication Sig Dispense Refill  . Ascorbic Acid (VITAMIN C PO) Take 1 tablet by mouth daily.    Marland Kitchen aspirin 81 MG tablet Take 81 mg by mouth daily.    . Cholecalciferol (VITAMIN D PO) Take by mouth daily.    Marland Kitchen glucosamine-chondroitin 500-400 MG tablet Take 1 tablet by mouth 3 (three) times daily.    . Multiple Vitamin (MULTIVITAMIN) tablet Take 1 tablet by mouth daily.    Marland Kitchen OVER THE COUNTER MEDICATION OTC Ginge Root 550 mg taking everyday      . OVER THE COUNTER MEDICATION OTC Cinnamon 1000 mg taking everyday    . OVER THE COUNTER MEDICATION OTC Acid reflux med taking     No facility-administered medications prior to visit.      Allergies:   Asa [aspirin]; Codeine; and Midol [aspirin-cinnamedrine-caffeine]   Social History   Social History  . Marital status: Married    Spouse name: Alicia Bass  . Number of children: 5  . Years of education: 12   Occupational History  . 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 Smoking status: Never Smoker  . Smokeless tobacco: Never Used  . Alcohol use Yes     Comment: 1-2 annually  . Drug use: No  . Sexual activity: Yes    Partners: Female    Birth control/ protection: Surgical   Other Topics Concern  . None   Social History Narrative   Lives with husband who was diagnosed with Parkinson's in 2014.  Upon her retirement 11/2016, she moved her mother into their home. Square dances, bowls and rides motorcycles with her husband. Her daughter and 2 grandchildren live with them.  Her son died by suicide at age 62.  Her husband also has 3 children from a previous relationship.  Family History:  The patient's family history includes Cancer in her father, maternal uncle, and sister; Diabetes in her sister; Early death in her son; Heart disease in her brother, mother, sister, and sister; Hyperlipidemia in her mother; Kidney disease in her paternal aunt; Mental illness (age of onset: 34) in her son; Stroke in her brother, mother, and sister.   ROS:   Please see the history of present illness.    ROS All other systems reviewed and are negative.   PHYSICAL EXAM:   VS:  BP 120/72   Pulse 70   Ht 5' 2.5" (1.588 m)   Wt 64.9 kg (143 lb)   SpO2 97%   BMI 25.74 kg/m    GEN: Well nourished, well developed, in no acute distress  HEENT: normal  Neck: no JVD, carotid bruits, or masses Cardiac: RRR; no murmurs, rubs,  or gallops,no edema  Respiratory:  clear to auscultation bilaterally, normal work of breathing GI: soft, nontender, nondistended, + BS MS: no deformity or atrophy  Skin: warm and dry, no rash Neuro:  Alert and Oriented x 3, Strength and sensation are intact Psych: euthymic mood, full affect  Wt Readings from Last 3 Encounters:  02/22/17 64.9 kg (143 lb)  01/04/17 64.7 kg (142 lb 9.6 oz)  12/17/16 63.7 kg (140 lb 6.4 oz)      Studies/Labs Reviewed:   EKG:  EKG is not ordered today.    Recent Labs: 11/30/2016: BUN 15; Creatinine, Ser 0.89; Hemoglobin 15.5; Potassium 3.8; Sodium 138 12/22/2016: TSH 1.85   Lipid Panel    Component Value Date/Time   CHOL 161 11/27/2013 1051   TRIG 90 11/27/2013 1051   HDL 46 11/27/2013 1051   CHOLHDL 3.5 11/27/2013 1051   VLDL 18 11/27/2013 1051   LDLCALC 97 11/27/2013 1051       ASSESSMENT:    No diagnosis found.   PLAN:  In order of problems listed above:  1. AFib: Her most recent and only documented episode of arrhythmia was clearly associated with a respiratory infection. It resolved spontaneously and did not recur during 30 day event monitoring. It may have been an isolated event.  Her palpitations seem to be associated with less complex atrial arrhythmia (PACs and PAT, nonsustained). Obviously, we cannot exclude recurrent atrial fibrillation during a longer monitoring period.  Alicia Bass is clearly aware of the potentially serious complications of embolic stroke from atrial fibrillation. She has seen her sister Alicia Bass suffer significantly from this. On the other hand Alicia Bass clearly prefers not to take anticoagulation.   We compared the statistics of embolic stroke with her risk profile versus the risk of bleeding with oral anticoagulants. The risk is essentially matched, with the risk of stroke and serious bleeding both at around 2-3% yearly. She does not want to take anticoagulants and I can't say that I disagree with her decision at this  time. However, I spent a long time impressing upon her that even the slightest/most transient neurological event (TIA) would heavily tilt the risk-benefit balance towards starting anticoagulants. She commits to calling immediately if she has any questionable neurological symptoms.    Medication Adjustments/Labs and Tests Ordered: Current medicines are reviewed at length with the patient today.  Concerns regarding medicines are outlined above.  Medication changes, Labs and Tests ordered today are listed in the Patient Instructions below. Patient Instructions  Dr Sallyanne Kuster recommends that you schedule a follow-up appointment in 1 year. You will receive a reminder letter in the mail two months in  advance. If you don't receive a letter, please call our office to schedule the follow-up appointment.  If you need a refill on your cardiac medications before your next appointment, please call your pharmacy.    Signed, Alicia Klein, MD  02/23/2017 5:46 PM    [facility] [address], [place], [place]  [address] Phone: [phone]; Fax: [phone]

## 2017-02-22 NOTE — Patient Instructions (Signed)
Dr Croitoru recommends that you schedule a follow-up appointment in 1 year. You will receive a reminder letter in the mail two months in advance. If you don't receive a letter, please call our office to schedule the follow-up appointment.  If you need a refill on your cardiac medications before your next appointment, please call your pharmacy. 

## 2017-02-23 DIAGNOSIS — I480 Paroxysmal atrial fibrillation: Secondary | ICD-10-CM | POA: Insufficient documentation

## 2017-12-08 ENCOUNTER — Ambulatory Visit (INDEPENDENT_AMBULATORY_CARE_PROVIDER_SITE_OTHER): Payer: Medicare PPO

## 2017-12-08 VITALS — BP 110/70 | HR 61 | Temp 97.5°F | Ht 63.0 in | Wt 143.4 lb

## 2017-12-08 DIAGNOSIS — Z0000 Encounter for general adult medical examination without abnormal findings: Secondary | ICD-10-CM | POA: Diagnosis not present

## 2017-12-08 DIAGNOSIS — Z13220 Encounter for screening for lipoid disorders: Secondary | ICD-10-CM

## 2017-12-08 DIAGNOSIS — Z131 Encounter for screening for diabetes mellitus: Secondary | ICD-10-CM

## 2017-12-08 NOTE — Patient Instructions (Addendum)
Alicia Bass , Thank you for taking time to come for your Medicare Wellness Visit. I appreciate your ongoing commitment to your health goals. Please review the following plan we discussed and let me know if I can assist you in the future.   Screening recommendations/referrals: Colonoscopy: up to date, next due 12/12/2022 Mammogram: up to date, next due 02/03/2019 Bone Density: up to date, next due 02/02/2021 Recommended yearly ophthalmology/optometry visit for glaucoma screening and checkup Recommended yearly dental visit for hygiene and checkup  Vaccinations: Influenza vaccine: up to date Pneumococcal vaccine: up to date Tdap vaccine: up to date, next due 06/08/2020 Shingles vaccine: up to date, Check with your pharmacy about receiving the Shingrix vaccine    Advanced directives: Please bring a copy of your POA (Power of [place]) and/or Living Will to your next appointment.   Conditions/risks identified: Try to lose about 10 lbs in the near future.  Next appointment: 12/14/17 @ 9 am with [place] 65 Years and Older, Female Preventive care refers to lifestyle choices and visits with your health care provider that can promote health and wellness. What does preventive care include?  A yearly physical exam. This is also called an annual well check.  Dental exams once or twice a year.  Routine eye exams. Ask your health care provider how often you should have your eyes checked.  Personal lifestyle choices, including:  Daily care of your teeth and gums.  Regular physical activity.  Eating a healthy diet.  Avoiding tobacco and drug use.  Limiting alcohol use.  Practicing safe sex.  Taking low-dose aspirin every day.  Taking vitamin and mineral supplements as recommended by your health care provider. What happens during an annual well check? The services and screenings done by your health care provider during your annual well check will depend on your  age, overall health, lifestyle risk factors, and family history of disease. Counseling  Your health care provider may ask you questions about your:  Alcohol use.  Tobacco use.  Drug use.  Emotional well-being.  Home and relationship well-being.  Sexual activity.  Eating habits.  History of falls.  Memory and ability to understand (cognition).  Work and work Statistician.  Reproductive health. Screening  You may have the following tests or measurements:  Height, weight, and BMI.  Blood pressure.  Lipid and cholesterol levels. These may be checked every 5 years, or more frequently if you are over 23 years old.  Skin check.  Lung cancer screening. You may have this screening every year starting at age 20 if you have a 30-pack-year history of smoking and currently smoke or have quit within the past 15 years.  Fecal occult blood test (FOBT) of the stool. You may have this test every year starting at age 48.  Flexible sigmoidoscopy or colonoscopy. You may have a sigmoidoscopy every 5 years or a colonoscopy every 10 years starting at age 21.  Hepatitis C blood test.  Hepatitis B blood test.  Sexually transmitted disease (STD) testing.  Diabetes screening. This is done by checking your blood sugar (glucose) after you have not eaten for a while (fasting). You may have this done every 1-3 years.  Bone density scan. This is done to screen for osteoporosis. You may have this done starting at age 56.  Mammogram. This may be done every 1-2 years. Talk to your health care provider about how often you should have regular mammograms. Talk with your health care provider about your  test results, treatment options, and if necessary, the need for more tests. Vaccines  Your health care provider may recommend certain vaccines, such as:  Influenza vaccine. This is recommended every year.  Tetanus, diphtheria, and acellular pertussis (Tdap, Td) vaccine. You may need a Td booster  every 10 years.  Zoster vaccine. You may need this after age 80.  Pneumococcal 13-valent conjugate (PCV13) vaccine. One dose is recommended after age 24.  Pneumococcal polysaccharide (PPSV23) vaccine. One dose is recommended after age 13. Talk to your health care provider about which screenings and vaccines you need and how often you need them. This information is not intended to replace advice given to you by your health care provider. Make sure you discuss any questions you have with your health care provider. Document Released: 01/09/2016 Document Revised: 09/01/2016 Document Reviewed: 10/14/2015 Elsevier Interactive Patient Education  2017 [employer] Prevention in the Home Falls can cause injuries. They can happen to people of all ages. There are many things you can do to make your home safe and to help prevent falls. What can I do on the outside of my home?  Regularly fix the edges of walkways and driveways and fix any cracks.  Remove anything that might make you trip as you walk through a door, such as a raised step or threshold.  Trim any bushes or trees on the path to your home.  Use bright outdoor lighting.  Clear any walking paths of anything that might make someone trip, such as rocks or tools.  Regularly check to see if handrails are loose or broken. Make sure that both sides of any steps have handrails.  Any raised decks and porches should have guardrails on the edges.  Have any leaves, snow, or ice cleared regularly.  Use sand or salt on walking paths during winter.  Clean up any spills in your garage right away. This includes oil or grease spills. What can I do in the bathroom?  Use night lights.  Install grab bars by the toilet and in the tub and shower. Do not use towel bars as grab bars.  Use non-skid mats or decals in the tub or shower.  If you need to sit down in the shower, use a plastic, non-slip stool.  Keep the floor dry. Clean up any  water that spills on the floor as soon as it happens.  Remove soap buildup in the tub or shower regularly.  Attach bath mats securely with double-sided non-slip rug tape.  Do not have throw rugs and other things on the floor that can make you trip. What can I do in the bedroom?  Use night lights.  Make sure that you have a light by your bed that is easy to reach.  Do not use any sheets or blankets that are too big for your bed. They should not hang down onto the floor.  Have a firm chair that has side arms. You can use this for support while you get dressed.  Do not have throw rugs and other things on the floor that can make you trip. What can I do in the kitchen?  Clean up any spills right away.  Avoid walking on wet floors.  Keep items that you use a lot in easy-to-reach places.  If you need to reach something above you, use a strong step stool that has a grab bar.  Keep electrical cords out of the way.  Do not use floor polish or wax that  makes floors slippery. If you must use wax, use non-skid floor wax.  Do not have throw rugs and other things on the floor that can make you trip. What can I do with my stairs?  Do not leave any items on the stairs.  Make sure that there are handrails on both sides of the stairs and use them. Fix handrails that are broken or loose. Make sure that handrails are as long as the stairways.  Check any carpeting to make sure that it is firmly attached to the stairs. Fix any carpet that is loose or worn.  Avoid having throw rugs at the top or bottom of the stairs. If you do have throw rugs, attach them to the floor with carpet tape.  Make sure that you have a light switch at the top of the stairs and the bottom of the stairs. If you do not have them, ask someone to add them for you. What else can I do to help prevent falls?  Wear shoes that:  Do not have high heels.  Have rubber bottoms.  Are comfortable and fit you well.  Are closed  at the toe. Do not wear sandals.  If you use a stepladder:  Make sure that it is fully opened. Do not climb a closed stepladder.  Make sure that both sides of the stepladder are locked into place.  Ask someone to hold it for you, if possible.  Clearly mark and make sure that you can see:  Any grab bars or handrails.  First and last steps.  Where the edge of each step is.  Use tools that help you move around (mobility aids) if they are needed. These include:  Canes.  Walkers.  Scooters.  Crutches.  Turn on the lights when you go into a dark area. Replace any light bulbs as soon as they burn out.  Set up your furniture so you have a clear path. Avoid moving your furniture around.  If any of your floors are uneven, fix them.  If there are any pets around you, be aware of where they are.  Review your medicines with your doctor. Some medicines can make you feel dizzy. This can increase your chance of falling. Ask your doctor what other things that you can do to help prevent falls. This information is not intended to replace advice given to you by your health care provider. Make sure you discuss any questions you have with your health care provider. Document Released: 10/09/2009 Document Revised: 05/20/2016 Document Reviewed: 01/17/2015 Elsevier Interactive Patient Education  2017 [employer].

## 2017-12-08 NOTE — Progress Notes (Signed)
Subjective:   Alicia Bass is a 71 y.o. female who presents for Medicare Annual (Subsequent) preventive examination.  Review of Systems:  N/A Cardiac Risk Factors include: advanced age (>55men, >27 women)     Objective:     Vitals: BP 110/70   Pulse 61   Temp (!) 97.5 F (36.4 C) (Oral)   Ht 5\' 3"  (1.6 m)   Wt 143 lb 6 oz (65 kg)   SpO2 98%   BMI 25.40 kg/m   Body mass index is 25.4 kg/m.  Advanced Directives 12/08/2017  Does Patient Have a Medical Advance Directive? Yes  Type of Paramedic of [place];Living will  Copy of [place] in Chart? No - copy requested    Tobacco Social History   Tobacco Use  Smoking Status Never Smoker  Smokeless Tobacco Never Used     Counseling given: Not Answered   Clinical Intake:  Pre-visit preparation completed: Yes  Pain : No/denies pain     Nutritional Status: BMI 25 -29 Overweight Nutritional Risks: None Diabetes: No  Activities of Daily Living: Independent Ambulation: Independent with device- listed below Home Assistive Devices/Equipment: Eyeglasses Medication Administration: Independent Home Management: Independent  Barriers to Care Management & Learning: None  Do you feel unsafe in your current relationship?: No Do you feel physically threatened by others?: No Anyone hurting you at home, work, or school?: No Unable to ask?: No  How often do you need to have someone help you when you read instructions, pamphlets, or other written materials from your doctor or pharmacy?: 1 - Never What is the last grade level you completed in school?: Some college  Interpreter Needed?: No  Information entered by :: Andrez Grime, LPN  Past Medical History:  Diagnosis Date  . Allergy   . Arthritis   . Basal cell carcinoma    LEFT anterior chest wall  . Fibromyalgia   . Hiatal hernia   . Shingles 2007   Past Surgical History:  Procedure Laterality Date  . ABDOMINAL  HYSTERECTOMY    . CHOLECYSTECTOMY    . excision     BCC, back   Family History  Problem Relation Age of Onset  . Heart disease Mother   . Stroke Mother   . Hyperlipidemia Mother   . Cancer Father   . Cancer Sister        "full blown cancer," "it started as a squeamish on her head"  . Heart disease Sister   . Diabetes Sister   . Heart disease Brother   . Stroke Brother   . Early death Son   . Mental illness Son 55       bipolar disorder  . Cancer Maternal Uncle   . Kidney disease Paternal Aunt   . Stroke Sister   . Heart disease Sister    Social History   Socioeconomic History  . Marital status: Married    Spouse name: Merin Borjon  . Number of children: 5  . Years of education: 42  . Highest education level: Some college, no degree  Social Needs  . Financial resource strain: Not hard at all  . Food insecurity - worry: Never true  . Food insecurity - inability: Never true  . Transportation needs - medical: No  . Transportation needs - non-medical: No  Occupational History  . 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  Smoking status: Never Smoker  . Smokeless tobacco: Never Used  Substance and Sexual Activity  . Alcohol use: Yes    Comment: 1-2 annually  . Drug use: No  . Sexual activity: Yes    Partners: Female    Birth control/protection: Surgical  Other Topics Concern  . None  Social History Narrative   Lives with husband who was diagnosed with Parkinson's in 2014.  Upon her retirement 11/2016, she moved her mother into their home. Square dances, bowls and rides motorcycles with her husband. Her daughter and 2 grandchildren live with them.  Her son died by suicide at age 56.  Her husband also has 3 children from a previous relationship.    Outpatient Encounter Medications as of 12/08/2017  Medication Sig  . Ascorbic Acid (VITAMIN C PO) Take 1 tablet by mouth daily.    Marland Kitchen aspirin 81 MG tablet Take 81 mg by mouth daily.  Marland Kitchen glucosamine-chondroitin 500-400 MG tablet Take 1 tablet by mouth 3 (three) times daily.  . Multiple Vitamin (MULTIVITAMIN) tablet Take 1 tablet by mouth daily.  Marland Kitchen OVER THE COUNTER MEDICATION OTC Ginge Root 550 mg taking everyday  . OVER THE COUNTER MEDICATION OTC Cinnamon 1000 mg taking everyday  . OVER THE COUNTER MEDICATION OTC Acid reflux med taking  . [DISCONTINUED] Cholecalciferol (VITAMIN D PO) Take by mouth daily.   No facility-administered encounter medications on file as of 12/08/2017.     Activities of Daily Living In your present state of health, do you have any difficulty performing the following activities: 12/08/2017  Hearing? N  Vision? N  Difficulty concentrating or making decisions? N  Walking or climbing stairs? N  Dressing or bathing? N  Doing errands, shopping? N  Preparing Food and eating ? N  Using the Toilet? N  In the past six months, have you accidently leaked urine? N  Do you have problems with loss of bowel control? N  Managing your Medications? N  Managing your Finances? N  Housekeeping or managing your Housekeeping? N  Some recent data might be hidden    Timed Get Up and Go performed: yes, passed, completed within 30 seconds  Patient Care Team: Harrison Mons, PA-C as PCP - General (Family Medicine) [place], Dani Gobble, MD as Consulting Physician (Cardiology) [place], Heather, OD (Optometry)    Assessment:    Exercise Activities and Dietary recommendations Current Exercise Habits: Structured exercise class, Type of exercise: walking;stretching(square dancing, bowling ), Time (Minutes): 60, Frequency (Times/Week): 7, Weekly Exercise (Minutes/Week): 420, Intensity: Moderate, Exercise limited by: None identified  Goals    . Weight (lb) < 133 lb (60.3 kg)     Patient wants to try to lose about 10 lbs in the near future.       Fall Risk Fall Risk  12/08/2017 01/04/2017 12/07/2016 12/06/2016  11/30/2016  Falls in the past year? No No No No No   Is the patient's home free of loose throw rugs in walkways, pet beds, electrical cords, etc?   yes      Grab bars in the bathroom? yes      Handrails on the stairs?   yes      Adequate lighting?   yes  Depression Screen PHQ 2/9 Scores 12/08/2017 01/04/2017 12/07/2016 12/06/2016  PHQ - 2 Score 0 0 0 0     Cognitive Function     6CIT Screen 12/08/2017  What Year? 0 points  What month? 0 points  What time? 0 points  Count back from 20  0 points  Months in reverse 0 points  Repeat phrase 4 points  Total Score 4    Immunization History  Administered Date(s) Administered  . Influenza Split 10/27/2013, 10/27/2017  . Influenza-Unspecified 11/05/2014, 09/27/2015, 10/27/2016  . Pneumococcal Conjugate-13 12/24/2015  . Pneumococcal Polysaccharide-23 11/27/2013  . Tdap 06/08/2010  . Zoster 10/16/2013   Screening Tests Health Maintenance  Topic Date Due  . MAMMOGRAM  02/03/2019  . TETANUS/TDAP  06/08/2020  . COLONOSCOPY  12/12/2022  . INFLUENZA VACCINE  Completed  . DEXA SCAN  Completed  . Hepatitis C Screening  Completed  . PNA vac Low Risk Adult  Completed   Cancer Screenings: Lung:  Low Dose CT Chest recommended if Age 46-80 years, 30 pack-year currently smoking OR have quit w/in 15years. Patient does not qualify. Breast:  Up to date on Mammogram? Yes, completed 02/03/17  Up to date of Bone Density/Dexa? Yes, completed 02/03/16 Colorectal: completed 12/12/12  Additional Screenings:  Hepatitis B/HIV/Syphillis:not indicated  Hepatitis C Screening: completed 11/27/13     Plan:   I have personally reviewed and noted the following in the patient's chart:   . Medical and social history . Use of alcohol, tobacco or illicit drugs  . Current medications and supplements . Functional ability and status . Nutritional status . Physical activity . Advanced directives . List of other physicians . Hospitalizations, surgeries, and ER  visits in previous 12 months . Vitals . Screenings to include cognitive, depression, and falls . Referrals and appointments  In addition, I have reviewed and discussed with patient certain preventive protocols, quality metrics, and best practice recommendations. A written personalized care plan for preventive services as well as general preventive health recommendations were provided to patient.   1. Screening cholesterol level - Lipid panel - Comprehensive metabolic panel  2. Screening for diabetes mellitus - CBC with Differential/Platelet - Urinalysis, Complete  3. Encounter for Medicare annual wellness exam    Andrez Grime, LPN  09/38/1829

## 2017-12-09 ENCOUNTER — Encounter: Payer: Medicare PPO | Admitting: Physician Assistant

## 2017-12-09 LAB — COMPREHENSIVE METABOLIC PANEL
A/G RATIO: 2.4 — AB (ref 1.2–2.2)
ALT: 32 IU/L (ref 0–32)
AST: 20 IU/L (ref 0–40)
Albumin: 4.8 g/dL (ref 3.6–4.8)
Alkaline Phosphatase: 91 IU/L (ref 39–117)
BUN/Creatinine Ratio: 19 (ref 12–28)
BUN: 12 mg/dL (ref 8–27)
Bilirubin Total: 0.3 mg/dL (ref 0.0–1.2)
CALCIUM: 9.3 mg/dL (ref 8.7–10.3)
CO2: 26 mmol/L (ref 20–29)
Chloride: 104 mmol/L (ref 96–106)
Creatinine, Ser: 0.63 mg/dL (ref 0.57–1.00)
GFR, EST AFRICAN AMERICAN: 106 mL/min/{1.73_m2} (ref >59)
GFR, EST NON AFRICAN AMERICAN: 92 mL/min/{1.73_m2} (ref >59)
GLUCOSE: 91 mg/dL (ref 65–99)
Globulin, Total: 2 g/dL (ref 1.5–4.5)
Potassium: 4.5 mmol/L (ref 3.5–5.2)
Sodium: 142 mmol/L (ref 134–144)
TOTAL PROTEIN: 6.8 g/dL (ref 6.0–8.5)

## 2017-12-09 LAB — URINALYSIS, COMPLETE
Bilirubin, UA: NEGATIVE
Glucose, UA: NEGATIVE
KETONES UA: NEGATIVE
NITRITE UA: NEGATIVE
Protein, UA: NEGATIVE
RBC, UA: NEGATIVE
Specific Gravity, UA: 1.007 (ref 1.005–1.030)
Urobilinogen, Ur: 0.2 mg/dL (ref 0.2–1.0)
pH, UA: 6.5 (ref 5.0–7.5)

## 2017-12-09 LAB — LIPID PANEL
CHOL/HDL RATIO: 4.3 ratio (ref 0.0–4.4)
Cholesterol, Total: 201 mg/dL — ABNORMAL HIGH (ref 100–199)
HDL: 47 mg/dL (ref >39)
LDL Calculated: 121 mg/dL — ABNORMAL HIGH (ref 0–99)
TRIGLYCERIDES: 166 mg/dL — AB (ref 0–149)
VLDL Cholesterol Cal: 33 mg/dL (ref 5–40)

## 2017-12-09 LAB — CBC WITH DIFFERENTIAL/PLATELET
BASOS: 1 %
Basophils Absolute: 0 10*3/uL (ref 0.0–0.2)
EOS (ABSOLUTE): 0.1 10*3/uL (ref 0.0–0.4)
Eos: 2 %
HEMOGLOBIN: 14.5 g/dL (ref 11.1–15.9)
Hematocrit: 43.3 % (ref 34.0–46.6)
IMMATURE GRANS (ABS): 0 10*3/uL (ref 0.0–0.1)
Immature Granulocytes: 0 %
LYMPHS: 26 %
Lymphocytes Absolute: 1.4 10*3/uL (ref 0.7–3.1)
MCH: 31.1 pg (ref 26.6–33.0)
MCHC: 33.5 g/dL (ref 31.5–35.7)
MCV: 93 fL (ref 79–97)
MONOCYTES: 7 %
Monocytes Absolute: 0.4 10*3/uL (ref 0.1–0.9)
NEUTROS ABS: 3.5 10*3/uL (ref 1.4–7.0)
Neutrophils: 64 %
Platelets: 262 10*3/uL (ref 150–379)
RBC: 4.66 x10E6/uL (ref 3.77–5.28)
RDW: 13.2 % (ref 12.3–15.4)
WBC: 5.4 10*3/uL (ref 3.4–10.8)

## 2017-12-09 LAB — MICROSCOPIC EXAMINATION
Casts: NONE SEEN /lpf
WBC, UA: NONE SEEN /hpf (ref 0–?)

## 2017-12-14 ENCOUNTER — Encounter: Payer: Self-pay | Admitting: Physician Assistant

## 2017-12-14 ENCOUNTER — Other Ambulatory Visit: Payer: Self-pay

## 2017-12-14 ENCOUNTER — Ambulatory Visit (INDEPENDENT_AMBULATORY_CARE_PROVIDER_SITE_OTHER): Payer: Medicare PPO | Admitting: Physician Assistant

## 2017-12-14 VITALS — BP 126/80 | HR 77 | Temp 97.9°F | Resp 18 | Ht 63.0 in | Wt 140.0 lb

## 2017-12-14 DIAGNOSIS — Z0000 Encounter for general adult medical examination without abnormal findings: Secondary | ICD-10-CM

## 2017-12-14 DIAGNOSIS — E785 Hyperlipidemia, unspecified: Secondary | ICD-10-CM | POA: Diagnosis not present

## 2017-12-14 MED ORDER — ATORVASTATIN CALCIUM 10 MG PO TABS: 10.0000 mg | ORAL_TABLET | Freq: Every day | ORAL | 3 refills | Status: DC

## 2017-12-14 NOTE — Progress Notes (Signed)
Chief Complaint  Patient presents with  . Annual Exam    Pt has already had blood and urine work done last week    Subjective:    Patient ID: Alicia Bass, female    DOB: February 13, 1946, 71 y.o.   MRN: [id number]  HPI Patient presents for annual exam.  Patient was seen in clinic last week 12/08/17 and had some blood work done.   She states that she has been doing well. No health concerns. Patient eats two times a day and snacks between meals. She eats out a lot but eats home cooked meals too. She exercises four times a week for about 30 minutes (arm exercises, sit ups and stretches).  She goes to the dentist twice and year. She gets eye exams once a year.   Review of Systems  Constitutional: Negative.   HENT: Negative for rhinorrhea and sore throat.   Eyes: Negative for visual disturbance.  Respiratory: Negative for cough and shortness of breath.   Cardiovascular: Negative for chest pain and palpitations.  Gastrointestinal: Negative for abdominal pain, blood in stool, constipation, diarrhea, nausea and vomiting.  Genitourinary: Negative for dysuria, frequency, hematuria and urgency.  Musculoskeletal: Positive for arthralgias and myalgias.       Patient has fibromyalgia  Skin: Negative for rash.  Neurological: Negative for light-headedness and headaches.  Psychiatric/Behavioral: Negative for decreased concentration. The patient is not nervous/anxious.    Patient Active Problem List   Diagnosis Date Noted  . Hyperlipidemia 12/14/2017  . Paroxysmal atrial fibrillation ([place]) 02/23/2017  . Pterygium eye 12/22/2014  . Abnormal resting ECG findings 01/08/2014  . Microscopic hematuria 12/17/2013  . Fibromyalgia 12/12/2012  . Female cystocele 12/12/2012  . Osteoporosis 12/12/2012  . Hiatal hernia    Current Meds  Medication Sig  . Ascorbic Acid (VITAMIN C PO) Take 1 tablet by mouth daily.  Marland Kitchen aspirin 81 MG tablet Take 81 mg by mouth daily.  Marland Kitchen glucosamine-chondroitin 500-400 MG  tablet Take 1 tablet by mouth 3 (three) times daily.  . Multiple Vitamin (MULTIVITAMIN) tablet Take 1 tablet by mouth daily.  Marland Kitchen OVER THE COUNTER MEDICATION OTC Ginge Root 550 mg taking everyday  . OVER THE COUNTER MEDICATION OTC Cinnamon 1000 mg taking everyday  . OVER THE COUNTER MEDICATION OTC Acid reflux med taking   Allergies  Allergen Reactions  . Asa [Aspirin] Other (See Comments)    "a whole big aspirin sets off my hiatal hernia." Tolerates low-dose.  . Codeine Other (See Comments)    Feels bad weird   . Midol [Aspirin-Cinnamedrine-Caffeine] Palpitations   Active Ambulatory Problems    Diagnosis Date Noted  . Fibromyalgia 12/12/2012  . Hiatal hernia   . Female cystocele 12/12/2012  . Osteoporosis 12/12/2012  . Microscopic hematuria 12/17/2013  . Abnormal resting ECG findings 01/08/2014  . Pterygium eye 12/22/2014  . Paroxysmal atrial fibrillation ([place]) 02/23/2017  . Hyperlipidemia 12/14/2017   Resolved Ambulatory Problems    Diagnosis Date Noted  . No Resolved Ambulatory Problems   Past Medical History:  Diagnosis Date  . Allergy   . Arthritis   . Basal cell carcinoma   . Fibromyalgia   . Hiatal hernia   . Shingles 2007   Past Surgical History:  Procedure Laterality Date  . ABDOMINAL HYSTERECTOMY    . CHOLECYSTECTOMY    . excision     BCC, back   Family History  Problem Relation Age of Onset  . Heart disease Mother   . Stroke Mother   .  Hyperlipidemia Mother   . Cancer Father   . Cancer Sister        "full blown cancer," "it started as a squeamish on her head"  . Heart disease Sister   . Diabetes Sister   . Heart disease Brother   . Stroke Brother   . Early death Son 46       suicide  . Mental illness Son 67       bipolar disorder  . Cancer Maternal Uncle   . Kidney disease Paternal Aunt   . Stroke Sister   . Heart disease Sister    Social History   Socioeconomic History  . Marital status: Married    Spouse name: Terrell Shimko  . Number  of children: 5  . Years of education: 31  . Highest education level: Some college, no degree  Social Needs  . Financial resource strain: Not hard at all  . Food insecurity - worry: Never true  . Food insecurity - inability: Never true  . Transportation needs - medical: No  . Transportation needs - non-medical: No  Occupational History  . 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 Smoking status: Never Smoker  . Smokeless tobacco: Never Used  Substance and Sexual Activity  . Alcohol use: Yes    Comment: 1-2 annually  . Drug use: No  . Sexual activity: Yes    Partners: Male    Birth control/protection: Surgical  Other Topics Concern  . Not on file  Social History Narrative   Lives with husband who was diagnosed with Parkinson's in 2013/03/31.  Upon her retirement 11/2016, she moved her mother into their home, and cared for her until her death in 2017/03/31.    Square dances, bowls and rides motorcycles with her husband, though much less so since his health decline.    Her daughter and 2 grandchildren live with them.     Her son died by suicide at age 31.     Her husband also has 3 children from a previous relationship.      Objective: Vitals:   12/14/17 0858  BP: 126/80  Pulse: 77  Resp: 18  Temp: 97.9 F (36.6 C)  SpO2: 97%     Physical Exam  Constitutional: She is oriented to person, place, and time. She appears well-developed and well-nourished. No distress.  HENT:  Head: Normocephalic and atraumatic.  Right Ear: External ear normal.  Left Ear: External ear normal.  Mouth/Throat: Oropharynx is clear and moist.  Eyes: Conjunctivae are normal. Pupils are equal, round, and reactive to light.  Pterygium noted in left eye.   Neck: Neck supple. No thyromegaly present.  Cardiovascular: Normal rate, regular rhythm, normal heart sounds and intact distal pulses. Exam reveals no gallop and no  friction rub.  No murmur heard. Pulmonary/Chest: Effort normal and breath sounds normal.  Abdominal: Soft. Bowel sounds are normal. There is no tenderness.  Lymphadenopathy:    She has no cervical adenopathy.  Neurological: She is alert and oriented to person, place, and time. She has normal reflexes.  Skin: Skin is warm.  Psychiatric: She has a normal mood and affect.      Assessment & Plan:  1. Annual physical exam Age appropriate anticipatory guidance provided.   2. Hyperlipidemia, unspecified hyperlipidemia type Discussed need to start low dose statin with patient due to elevated lipids. Patient has fibromyalgia. She will  let us know if she has increased myalgia with the statin. - atorvastatin (LIPITOR) 10 MG tablet; Take 1 tablet (10 mg total) by mouth daily.  Dispense: 90 tablet; Refill: 3  Follow up in  3 months for re-evaluation of cholesterol and fasting labs.   [place], [place]

## 2017-12-14 NOTE — Assessment & Plan Note (Signed)
Healthy eating and regular exercise. Start low-dose atorvastatin. Monitor for increased myalgias.

## 2017-12-14 NOTE — Progress Notes (Signed)
Patient ID: Alicia Bass, female    DOB: 1946/05/22, 71 y.o.   MRN: [id number]  PCP: Harrison Mons, PA-C  Chief Complaint  Patient presents with  . Annual Exam    Pt has already had blood and urine work done last week     Subjective:   Presents for [employer].  Health maintenance gaps: Shingrix  Cervical Cancer Screening: s/p hysterectomy (ovaries remain) at age 15 due to "my white cells changed." Sounds like she had precancerous cells on the cervix. Was seeing Dr. Ree Edman then. Breast Cancer Screening: CBE today. Mammogram 02/03/2017. Colorectal Cancer Screening: 2013 Bone Density Testing: 02/03/2016, osteoporosis, but significant improvement from 2015. HIV Screening: no longer a candidate, very low risk STI Screening: declines, very low risk Seasonal Influenza Vaccination: current Td/Tdap Vaccination: current, repeat 05/2020 Pneumococcal Vaccination: series complete Zoster Vaccination: had Zostavax, needs Shingrix Frequency of Dental evaluation: Q6 months Frequency of Eye evaluation: annually    Patient Active Problem List   Diagnosis Date Noted  . Hyperlipidemia 12/14/2017  . Paroxysmal atrial fibrillation ([place]) 02/23/2017  . Pterygium eye 12/22/2014  . Abnormal resting ECG findings 01/08/2014  . Microscopic hematuria 12/17/2013  . Fibromyalgia 12/12/2012  . Female cystocele 12/12/2012  . Osteoporosis 12/12/2012  . Hiatal hernia     Past Medical History:  Diagnosis Date  . Allergy   . Arthritis   . Basal cell carcinoma    LEFT anterior chest wall  . Fibromyalgia   . Hiatal hernia   . Shingles 2007     Prior to Admission medications   Medication Sig Start Date End Date Taking? Authorizing Provider  Ascorbic Acid (VITAMIN C PO) Take 1 tablet by mouth daily.   Yes [provider]  aspirin 81 MG tablet Take 81 mg by mouth daily.   Yes [provider]  glucosamine-chondroitin 500-400 MG tablet Take 1 tablet by mouth 3 (three)  times daily.   Yes [provider]  Multiple Vitamin (MULTIVITAMIN) tablet Take 1 tablet by mouth daily.   Yes [provider]  OVER THE COUNTER MEDICATION OTC Ginge Root 550 mg taking everyday   Yes [provider]  OVER THE COUNTER MEDICATION OTC Cinnamon 1000 mg taking everyday   Yes [provider]  OVER THE COUNTER MEDICATION OTC Acid reflux med taking   Yes [provider]    Allergies  Allergen Reactions  . Asa [Aspirin] Other (See Comments)    "a whole big aspirin sets off my hiatal hernia." Tolerates low-dose.  . Codeine Other (See Comments)    Feels bad weird   . Midol [Aspirin-Cinnamedrine-Caffeine] Palpitations    Past Surgical History:  Procedure Laterality Date  . ABDOMINAL HYSTERECTOMY    . CHOLECYSTECTOMY    . excision     BCC, back    Family History  Problem Relation Age of Onset  . Heart disease Mother   . Stroke Mother   . Hyperlipidemia Mother   . Cancer Father   . Cancer Sister        "full blown cancer," "it started as a squeamish on her head"  . Heart disease Sister   . Diabetes Sister   . Heart disease Brother   . Stroke Brother   . Early death Son 71       suicide  . Mental illness Son 24       bipolar disorder  . Cancer Maternal Uncle   . Kidney disease Paternal Aunt   .  Stroke Sister   . Heart disease Sister     Social History   Socioeconomic History  . Marital status: Married    Spouse name: Christel Bai  . Number of children: 5  . Years of education: 31  . Highest education level: Some college, no degree  Social Needs  . Financial resource strain: Not hard at all  . Food insecurity - worry: Never true  . Food insecurity - inability: Never true  . Transportation needs - medical: No  . Transportation needs - non-medical: No  Occupational History  . 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  Smoking status: Never Smoker  . Smokeless tobacco: Never Used  Substance and Sexual Activity  . Alcohol use: Yes    Comment: 1-2 annually  . Drug use: No  . Sexual activity: Yes    Partners: Male    Birth control/protection: Surgical  Other Topics Concern  . None  Social History Narrative   Lives with husband who was diagnosed with Parkinson's in 04/20/2013.  Upon her retirement 11/2016, she moved her mother into their home, and cared for her until her death in 04/20/17.    Square dances, bowls and rides motorcycles with her husband, though much less so since his health decline.    Her daughter and 2 grandchildren live with them.     Her son died by suicide at age 41.     Her husband also has 3 children from a previous relationship.       Review of Systems  Constitutional: Negative.   HENT: Negative.   Eyes: Negative.   Respiratory: Negative.   Cardiovascular: Negative.   Gastrointestinal: Negative.   Endocrine: Negative.   Genitourinary: Negative.   Musculoskeletal: Positive for arthralgias and myalgias. Negative for back pain, gait problem, joint swelling, neck pain and neck stiffness.  Skin: Negative.   Allergic/Immunologic: Negative.   Neurological: Negative.   Hematological: Negative.   Psychiatric/Behavioral: Negative.         Objective:  Physical Exam  Constitutional: She is oriented to person, place, and time. Vital signs are normal. She appears well-developed and well-nourished. She is active and cooperative. No distress.  BP 126/80 (BP Location: Left Arm, Patient Position: Sitting, Cuff Size: Normal)   Pulse 77   Temp 97.9 F (36.6 C) (Oral)   Resp 18   Ht 5\' 3"  (1.6 m)   Wt 140 lb (63.5 kg)   SpO2 97%   BMI 24.80 kg/m    HENT:  Head: Normocephalic and atraumatic.  Right Ear: Hearing, tympanic membrane, external ear and ear canal normal. No foreign bodies.  Left Ear: Hearing, tympanic membrane, external ear and ear canal normal. No foreign bodies.  Nose:  Nose normal.  Mouth/Throat: Uvula is midline, oropharynx is clear and moist and mucous membranes are normal. No oral lesions. Normal dentition. No dental abscesses or uvula swelling. No oropharyngeal exudate.  Eyes: Conjunctivae, EOM and lids are normal. Pupils are equal, round, and reactive to light. Right eye exhibits no discharge. Left eye exhibits no discharge. No scleral icterus.  Fundoscopic exam:      The right eye shows no arteriolar narrowing, no AV nicking, no exudate, no hemorrhage and no papilledema. The right eye shows red reflex.       The left eye shows no arteriolar narrowing, no AV nicking, no exudate, no hemorrhage and no papilledema. The  left eye shows red reflex.  Neck: Trachea normal, normal range of motion and full passive range of motion without pain. Neck supple. No spinous process tenderness and no muscular tenderness present. No thyroid mass and no thyromegaly present.  Cardiovascular: Normal rate, regular rhythm, normal heart sounds, intact distal pulses and normal pulses.  Pulmonary/Chest: Effort normal and breath sounds normal.  Musculoskeletal: She exhibits no edema or tenderness.       Cervical back: Normal.       Thoracic back: Normal.       Lumbar back: Normal.  Lymphadenopathy:       Head (right side): No tonsillar, no preauricular, no posterior auricular and no occipital adenopathy present.       Head (left side): No tonsillar, no preauricular, no posterior auricular and no occipital adenopathy present.    She has no cervical adenopathy.       Right: No supraclavicular adenopathy present.       Left: No supraclavicular adenopathy present.  Neurological: She is alert and oriented to person, place, and time. She has normal strength and normal reflexes. No cranial nerve deficit. She exhibits normal muscle tone. Coordination and gait normal.  Skin: Skin is warm, dry and intact. No rash noted. She is not diaphoretic. No cyanosis or erythema. Nails show no clubbing.    Psychiatric: She has a normal mood and affect. Her speech is normal and behavior is normal. Judgment and thought content normal.       Results for orders placed or performed in visit on 12/08/17  Microscopic Examination  Result Value Ref Range   WBC, UA None seen 0 - 5 /hpf   RBC, UA 0-2 0 - 2 /hpf   Epithelial Cells (non renal) 0-10 0 - 10 /hpf   Casts None seen None seen /lpf   Mucus, UA Present Not Estab.   Bacteria, UA Few None seen/Few  Lipid panel  Result Value Ref Range   Cholesterol, Total 201 (H) 100 - 199 mg/dL   Triglycerides 166 (H) 0 - 149 mg/dL   HDL 47 >39 mg/dL   VLDL Cholesterol Cal 33 5 - 40 mg/dL   LDL Calculated 121 (H) 0 - 99 mg/dL   Chol/HDL Ratio 4.3 0.0 - 4.4 ratio  Comprehensive metabolic panel  Result Value Ref Range   Glucose 91 65 - 99 mg/dL   BUN 12 8 - 27 mg/dL   Creatinine, Ser 0.63 0.57 - 1.00 mg/dL   GFR calc non Af Amer 92 >59 mL/min/1.73   GFR calc Af Amer 106 >59 mL/min/1.73   BUN/Creatinine Ratio 19 12 - 28   Sodium 142 134 - 144 mmol/L   Potassium 4.5 3.5 - 5.2 mmol/L   Chloride 104 96 - 106 mmol/L   CO2 26 20 - 29 mmol/L   Calcium 9.3 8.7 - 10.3 mg/dL   Total Protein 6.8 6.0 - 8.5 g/dL   Albumin 4.8 3.6 - 4.8 g/dL   Globulin, Total 2.0 1.5 - 4.5 g/dL   Albumin/Globulin Ratio 2.4 (H) 1.2 - 2.2   Bilirubin Total 0.3 0.0 - 1.2 mg/dL   Alkaline Phosphatase 91 39 - 117 IU/L   AST 20 0 - 40 IU/L   ALT 32 0 - 32 IU/L  CBC with Differential/Platelet  Result Value Ref Range   WBC 5.4 3.4 - 10.8 x10E3/uL   RBC 4.66 3.77 - 5.28 x10E6/uL   Hemoglobin 14.5 11.1 - 15.9 g/dL   Hematocrit 43.3 34.0 - 46.6 %  MCV 93 79 - 97 fL   MCH 31.1 26.6 - 33.0 pg   MCHC 33.5 31.5 - 35.7 g/dL   RDW 13.2 12.3 - 15.4 %   Platelets 262 150 - 379 x10E3/uL   Neutrophils 64 Not Estab. %   Lymphs 26 Not Estab. %   Monocytes 7 Not Estab. %   Eos 2 Not Estab. %   Basos 1 Not Estab. %   Neutrophils Absolute 3.5 1.4 - 7.0 x10E3/uL   Lymphocytes Absolute  1.4 0.7 - 3.1 x10E3/uL   Monocytes Absolute 0.4 0.1 - 0.9 x10E3/uL   EOS (ABSOLUTE) 0.1 0.0 - 0.4 x10E3/uL   Basophils Absolute 0.0 0.0 - 0.2 x10E3/uL   Immature Granulocytes 0 Not Estab. %   Immature Grans (Abs) 0.0 0.0 - 0.1 x10E3/uL  Urinalysis, Complete  Result Value Ref Range   Specific Gravity, UA 1.007 1.005 - 1.030   pH, UA 6.5 5.0 - 7.5   Color, UA Yellow Yellow   Appearance Ur Clear Clear   Leukocytes, UA Trace (A) Negative   Protein, UA Negative Negative/Trace   Glucose, UA Negative Negative   Ketones, UA Negative Negative   RBC, UA Negative Negative   Bilirubin, UA Negative Negative   Urobilinogen, Ur 0.2 0.2 - 1.0 mg/dL   Nitrite, UA Negative Negative   Microscopic Examination See below:        Assessment & Plan:   Problem List Items Addressed This Visit    Hyperlipidemia    Healthy eating and regular exercise. Start low-dose atorvastatin. Monitor for increased myalgias.      Relevant Medications   atorvastatin (LIPITOR) 10 MG tablet    Other Visit Diagnoses    Annual physical exam    -  Primary   Age appropriate health guidance provided       Return in about 3 months (around 03/14/2018) for re-evalaution of cholesterol, fasting labs.   Fara Chute, PA-C [facility] [place]

## 2017-12-14 NOTE — Patient Instructions (Addendum)
We recommend that you obtain the new and improved shingles vaccine, Singrix, at your local pharmacy. It's a 2-dose series. There has been a Teacher, music, so you may be put on a wait list.    IF you received an x-ray today, you will receive an invoice from [facility]. Please contact [facility] at [phone] with questions or concerns regarding your invoice.   IF you received labwork today, you will receive an invoice from [facility]. Please contact [facility] at [phone] with questions or concerns regarding your invoice.   Our billing staff will not be able to assist you with questions regarding bills from these companies.  You will be contacted with the lab results as soon as they are available. The fastest way to get your results is to activate your My Chart account. Instructions are located on the last page of this paperwork. If you have not heard from [place] regarding the results in 2 weeks, please contact this office.     Preventive Care 69 Years and Older, Female Preventive care refers to lifestyle choices and visits with your health care provider that can promote health and wellness. What does preventive care include?  A yearly physical exam. This is also called an annual well check.  Dental exams once or twice a year.  Routine eye exams. Ask your health care provider how often you should have your eyes checked.  Personal lifestyle choices, including: ? Daily care of your teeth and gums. ? Regular physical activity. ? Eating a healthy diet. ? Avoiding tobacco and drug use. ? Limiting alcohol use. ? Practicing safe sex. ? Taking low-dose aspirin every day. ? Taking vitamin and mineral supplements as recommended by your health care provider. What happens during an annual well check? The services and screenings done by your health care provider during your annual well check will depend on your age, overall health, lifestyle risk factors, and family  history of disease. Counseling Your health care provider may ask you questions about your:  Alcohol use.  Tobacco use.  Drug use.  Emotional well-being.  Home and relationship well-being.  Sexual activity.  Eating habits.  History of falls.  Memory and ability to understand (cognition).  Work and work Statistician.  Reproductive health.  Screening You may have the following tests or measurements:  Height, weight, and BMI.  Blood pressure.  Lipid and cholesterol levels. These may be checked every 5 years, or more frequently if you are over 29 years old.  Skin check.  Lung cancer screening. You may have this screening every year starting at age 82 if you have a 30-pack-year history of smoking and currently smoke or have quit within the past 15 years.  Fecal occult blood test (FOBT) of the stool. You may have this test every year starting at age 23.  Flexible sigmoidoscopy or colonoscopy. You may have a sigmoidoscopy every 5 years or a colonoscopy every 10 years starting at age 55.  Hepatitis C blood test.  Hepatitis B blood test.  Sexually transmitted disease (STD) testing.  Diabetes screening. This is done by checking your blood sugar (glucose) after you have not eaten for a while (fasting). You may have this done every 1-3 years.  Bone density scan. This is done to screen for osteoporosis. You may have this done starting at age 70.  Mammogram. This may be done every 1-2 years. Talk to your health care provider about how often you should have regular mammograms.  Talk with your health care provider  about your test results, treatment options, and if necessary, the need for more tests. Vaccines Your health care provider may recommend certain vaccines, such as:  Influenza vaccine. This is recommended every year.  Tetanus, diphtheria, and acellular pertussis (Tdap, Td) vaccine. You may need a Td booster every 10 years.  Varicella vaccine. You may need this if  you have not been vaccinated.  Zoster vaccine. You may need this after age 42.  Measles, mumps, and rubella (MMR) vaccine. You may need at least one dose of MMR if you were born in 1957 or later. You may also need a second dose.  Pneumococcal 13-valent conjugate (PCV13) vaccine. One dose is recommended after age 18.  Pneumococcal polysaccharide (PPSV23) vaccine. One dose is recommended after age 32.  Meningococcal vaccine. You may need this if you have certain conditions.  Hepatitis A vaccine. You may need this if you have certain conditions or if you travel or work in places where you may be exposed to hepatitis A.  Hepatitis B vaccine. You may need this if you have certain conditions or if you travel or work in places where you may be exposed to hepatitis B.  Haemophilus influenzae type b (Hib) vaccine. You may need this if you have certain conditions.  Talk to your health care provider about which screenings and vaccines you need and how often you need them. This information is not intended to replace advice given to you by your health care provider. Make sure you discuss any questions you have with your health care provider. Document Released: 01/09/2016 Document Revised: 09/01/2016 Document Reviewed: 10/14/2015 Elsevier Interactive Patient Education  [employer].

## 2018-01-27 ENCOUNTER — Telehealth: Payer: Self-pay | Admitting: Physician Assistant

## 2018-01-29 NOTE — Telephone Encounter (Signed)
Patient asking about her pneumococcal vaccine status.  Immunization History  Administered Date(s) Administered  . Influenza Split 10/27/2013, 10/27/2017  . Influenza-Unspecified 11/05/2014, 09/27/2015, 10/27/2016  . Pneumococcal Conjugate-13 12/24/2015  . Pneumococcal Polysaccharide-23 11/27/2013  . Tdap 06/08/2010  . Zoster 10/16/2013   Series is complete

## 2018-01-30 ENCOUNTER — Encounter: Payer: Self-pay | Admitting: Physician Assistant

## 2018-03-14 ENCOUNTER — Ambulatory Visit: Payer: Medicare PPO | Admitting: Physician Assistant

## 2018-03-22 ENCOUNTER — Ambulatory Visit: Payer: Medicare PPO | Admitting: Physician Assistant

## 2018-03-22 ENCOUNTER — Encounter: Payer: Self-pay | Admitting: Physician Assistant

## 2018-03-22 ENCOUNTER — Other Ambulatory Visit: Payer: Self-pay

## 2018-03-22 VITALS — BP 128/82 | HR 72 | Temp 97.6°F | Resp 16 | Ht 63.0 in | Wt 142.4 lb

## 2018-03-22 DIAGNOSIS — N8110 Cystocele, unspecified: Secondary | ICD-10-CM | POA: Diagnosis not present

## 2018-03-22 DIAGNOSIS — M797 Fibromyalgia: Secondary | ICD-10-CM | POA: Diagnosis not present

## 2018-03-22 DIAGNOSIS — E785 Hyperlipidemia, unspecified: Secondary | ICD-10-CM

## 2018-03-22 LAB — LIPID PANEL
CHOL/HDL RATIO: 3.5 ratio (ref 0.0–4.4)
CHOLESTEROL TOTAL: 194 mg/dL (ref 100–199)
HDL: 56 mg/dL (ref >39)
LDL CALC: 111 mg/dL — AB (ref 0–99)
Triglycerides: 133 mg/dL (ref 0–149)
VLDL CHOLESTEROL CAL: 27 mg/dL (ref 5–40)

## 2018-03-22 LAB — COMPREHENSIVE METABOLIC PANEL
ALBUMIN: 4.6 g/dL (ref 3.6–4.8)
ALT: 43 IU/L — ABNORMAL HIGH (ref 0–32)
AST: 29 IU/L (ref 0–40)
Albumin/Globulin Ratio: 2.6 — ABNORMAL HIGH (ref 1.2–2.2)
Alkaline Phosphatase: 82 IU/L (ref 39–117)
BUN / CREAT RATIO: 16 (ref 12–28)
BUN: 13 mg/dL (ref 8–27)
Bilirubin Total: 0.3 mg/dL (ref 0.0–1.2)
CALCIUM: 9.3 mg/dL (ref 8.7–10.3)
CO2: 21 mmol/L (ref 20–29)
CREATININE: 0.83 mg/dL (ref 0.57–1.00)
Chloride: 106 mmol/L (ref 96–106)
GFR calc Af Amer: 83 mL/min/{1.73_m2} (ref >59)
GFR, EST NON AFRICAN AMERICAN: 72 mL/min/{1.73_m2} (ref >59)
GLOBULIN, TOTAL: 1.8 g/dL (ref 1.5–4.5)
GLUCOSE: 95 mg/dL (ref 65–99)
Potassium: 4.1 mmol/L (ref 3.5–5.2)
SODIUM: 143 mmol/L (ref 134–144)
TOTAL PROTEIN: 6.4 g/dL (ref 6.0–8.5)

## 2018-03-22 NOTE — Assessment & Plan Note (Signed)
Progressed. Refer back to urology to discuss options for treatment.

## 2018-03-22 NOTE — Assessment & Plan Note (Signed)
Await labs. Adjust regimen as indicated by results. Consider trial of rosuvastatin 5 mg before abandoning statin class. Consider Niaspan.

## 2018-03-22 NOTE — Assessment & Plan Note (Signed)
Aggravated by atorvastatin. Back to baseline.

## 2018-03-22 NOTE — Patient Instructions (Signed)
     IF you received an x-ray today, you will receive an invoice from [facility]. Please contact [facility] at [phone] with questions or concerns regarding your invoice.   IF you received labwork today, you will receive an invoice from [facility]. Please contact [facility] at [phone] with questions or concerns regarding your invoice.   Our billing staff will not be able to assist you with questions regarding bills from these companies.  You will be contacted with the lab results as soon as they are available. The fastest way to get your results is to activate your My Chart account. Instructions are located on the last page of this paperwork. If you have not heard from us regarding the results in 2 weeks, please contact this office.     

## 2018-03-22 NOTE — Progress Notes (Signed)
Patient ID: Alicia Bass, female    DOB: September 02, 1946, 72 y.o.   MRN: [id number]  PCP: Harrison Mons, PA-C  Chief Complaint  Patient presents with  . Hyperlipidemia    follow up     Subjective:   Presents for evaluation of hyperlipidemia.  Stopped the atorvastatin after 4 weeks, about 8 weeks ago, due to severe muscle aches. "Flared up the fibromyalgia." Also notes that she ate a lot of fat back over the winder holidays, so hopes that dietary changes have helped.  Known cystocele. Urinary symptoms are worsening. Ready to consider treatment. Has to re-insert the bladder into the vagina in order to urinate. Uncomfortable when it "falls out" while walking. Inhibits her from enjoying intimacy.  Skydiving this weekend, to celebrate her 72th birthday!   Review of Systems  Constitutional: Negative.   HENT: Negative for sore throat.   Eyes: Negative for visual disturbance.  Respiratory: Negative for cough, chest tightness, shortness of breath and wheezing.   Cardiovascular: Negative for chest pain and palpitations.  Gastrointestinal: Negative for abdominal pain, diarrhea, nausea and vomiting.  Genitourinary: Negative for dysuria, frequency, hematuria and urgency.       Cystocele  Musculoskeletal: Positive for myalgias. Negative for arthralgias.  Skin: Negative for rash.  Neurological: Negative for dizziness, weakness and headaches.  Psychiatric/Behavioral: Negative for decreased concentration. The patient is not nervous/anxious.        Patient Active Problem List   Diagnosis Date Noted  . Hyperlipidemia 12/14/2017  . Paroxysmal atrial fibrillation ([place]) 02/23/2017  . Pterygium eye 12/22/2014  . Abnormal resting ECG findings 01/08/2014  . Microscopic hematuria 12/17/2013  . Fibromyalgia 12/12/2012  . Female cystocele 12/12/2012  . Osteoporosis 12/12/2012  . Hiatal hernia      Prior to Admission medications   Medication Sig Start Date End Date Taking? Authorizing  Provider  Ascorbic Acid (VITAMIN C PO) Take 1 tablet by mouth daily.   Yes [provider]  aspirin 81 MG tablet Take 81 mg by mouth daily.   Yes [provider]  glucosamine-chondroitin 500-400 MG tablet Take 1 tablet by mouth 3 (three) times daily.   Yes [provider]  Multiple Vitamin (MULTIVITAMIN) tablet Take 1 tablet by mouth daily.   Yes [provider]  OVER THE COUNTER MEDICATION OTC Ginge Root 550 mg taking everyday   Yes [provider]  OVER THE COUNTER MEDICATION OTC Cinnamon 1000 mg taking everyday   Yes [provider]  OVER THE COUNTER MEDICATION OTC Acid reflux med taking   Yes [provider]  [facility] injection TO BE ADMINISTERED BY PHARMACIST FOR IMMUNIZATION 02/24/18  Yes [provider]  atorvastatin (LIPITOR) 10 MG tablet Take 1 tablet (10 mg total) by mouth daily. Patient not taking: Reported on 03/22/2018 12/14/17   Harrison Mons, PA-C     Allergies  Allergen Reactions  . Asa [Aspirin] Other (See Comments)    "a whole big aspirin sets off my hiatal hernia." Tolerates low-dose.  . Codeine Other (See Comments)    Feels bad weird   . Midol [Aspirin-Cinnamedrine-Caffeine] Palpitations       Objective:  Physical Exam  Constitutional: She is oriented to person, place, and time. She appears well-developed and well-nourished. She is active and cooperative. No distress.  BP 128/82   Pulse 72   Temp 97.6 F (36.4 C)   Resp 16   Ht 5\' 3"  (1.6 m)   Wt 142 lb 6.4 oz (64.6 kg)  SpO2 98%   BMI 25.23 kg/m   HENT:  Head: Normocephalic and atraumatic.  Right Ear: Hearing normal.  Left Ear: Hearing normal.  Eyes: Conjunctivae are normal. No scleral icterus.  Neck: Normal range of motion. Neck supple. No thyromegaly present.  Cardiovascular: Normal rate, regular rhythm and normal heart sounds.  Pulses:      Radial pulses are 2+ on the right side, and 2+ on the left side.  Pulmonary/Chest:  Effort normal and breath sounds normal.  Lymphadenopathy:       Head (right side): No tonsillar, no preauricular, no posterior auricular and no occipital adenopathy present.       Head (left side): No tonsillar, no preauricular, no posterior auricular and no occipital adenopathy present.    She has no cervical adenopathy.       Right: No supraclavicular adenopathy present.       Left: No supraclavicular adenopathy present.  Neurological: She is alert and oriented to person, place, and time. No sensory deficit.  Skin: Skin is warm, dry and intact. No rash noted. No cyanosis or erythema. Nails show no clubbing.  Psychiatric: She has a normal mood and affect. Her speech is normal and behavior is normal.      Assessment & Plan:   Problem List Items Addressed This Visit    Fibromyalgia (Chronic)    Aggravated by atorvastatin. Back to baseline.      Female cystocele (Chronic)    Progressed. Refer back to urology to discuss options for treatment.      Relevant Orders   Ambulatory referral to Urology   Hyperlipidemia - Primary    Await labs. Adjust regimen as indicated by results. Consider trial of rosuvastatin 5 mg before abandoning statin class. Consider Niaspan.      Relevant Orders   Comprehensive metabolic panel   Lipid panel       Return in about 3 months (around 06/22/2018) for re-evaluation of cholesterol.   Fara Chute, PA-C [facility] [place]

## 2018-03-23 ENCOUNTER — Encounter: Payer: Self-pay | Admitting: Physician Assistant

## 2018-03-27 ENCOUNTER — Telehealth: Payer: Self-pay | Admitting: Physician Assistant

## 2018-03-27 NOTE — Telephone Encounter (Signed)
Pt advised.

## 2018-03-27 NOTE — Telephone Encounter (Signed)
Copied from [place] [id number]. Topic: Quick Communication - Lab Results >> Mar 27, 2018 12:18 PM Cecelia Byars, NT wrote: Patient called and would like a call regarding her labs done on 03/22/18 please call her at [phone]

## 2018-04-03 ENCOUNTER — Encounter: Payer: Self-pay | Admitting: Physician Assistant

## 2018-06-12 ENCOUNTER — Other Ambulatory Visit: Payer: Self-pay | Admitting: Urology

## 2018-06-27 ENCOUNTER — Encounter (HOSPITAL_COMMUNITY): Payer: Self-pay

## 2018-06-27 NOTE — Pre-Procedure Instructions (Signed)
Audie Pinto PA last office visit note 03/22/2018

## 2018-06-27 NOTE — Patient Instructions (Signed)
Your procedure is scheduled on: Tuesday, July 11, 2018   Surgery Time:  8:00AM-11:00AM   Report to [facility] Main  Entrance    Report to admitting at 6:00 AM   Call this number if you have problems the morning of surgery [phone]   Do not eat food or drink liquids :After Midnight.   Do NOT smoke after Midnight   Take these medicines the morning of surgery with A SIP OF WATER: None                               You may not have any metal on your body including hair pins, jewelry, and body piercings             Do not wear make-up, lotions, powders, perfumes/cologne, or deodorant             Do not wear nail polish.  Do not shave  48 hours prior to surgery.                Do not bring valuables to the hospital. [place].   Contacts, dentures or bridgework may not be worn into surgery.   Leave suitcase in the car. After surgery it may be brought to your room.   Special Instructions: Bring a copy of your healthcare power of attorney and living will documents         the day of surgery if you haven't scanned them in before.              Please read over the following fact sheets you were given:  [facility] - Preparing for Surgery Before surgery, you can play an important role.  Because skin is not sterile, your skin needs to be as free of germs as possible.  You can reduce the number of germs on your skin by washing with CHG (chlorahexidine gluconate) soap before surgery.  CHG is an antiseptic cleaner which kills germs and bonds with the skin to continue killing germs even after washing. Please DO NOT use if you have an allergy to CHG or antibacterial soaps.  If your skin becomes reddened/irritated stop using the CHG and inform your nurse when you arrive at Short Stay. Do not shave (including legs and underarms) for at least 48 hours prior to the first CHG shower.  You may shave your face/neck.  Please follow these  instructions carefully:  1.  Shower with CHG Soap the night before surgery and the  morning of surgery.  2.  If you choose to wash your hair, wash your hair first as usual with your normal  shampoo.  3.  After you shampoo, rinse your hair and body thoroughly to remove the shampoo.                             4.  Use CHG as you would any other liquid soap.  You can apply chg directly to the skin and wash.  Gently with a scrungie or clean washcloth.  5.  Apply the CHG Soap to your body ONLY FROM THE NECK DOWN.   Do   not use on face/ open  Wound or open sores. Avoid contact with eyes, ears mouth and   genitals (private parts).                       Wash face,  Genitals (private parts) with your normal soap.             6.  Wash thoroughly, paying special attention to the area where your    surgery  will be performed.  7.  Thoroughly rinse your body with warm water from the neck down.  8.  DO NOT shower/wash with your normal soap after using and rinsing off the CHG Soap.                9.  Pat yourself dry with a clean towel.            10.  Wear clean pajamas.            11.  Place clean sheets on your bed the night of your first shower and do not  sleep with pets. Day of Surgery : Do not apply any lotions/deodorants the morning of surgery.  Please wear clean clothes to the hospital/surgery center.  FAILURE TO FOLLOW THESE INSTRUCTIONS MAY RESULT IN THE CANCELLATION OF YOUR SURGERY  PATIENT SIGNATURE_________________________________  NURSE SIGNATURE__________________________________  ________________________________________________________________________  WHAT IS A BLOOD TRANSFUSION? Blood Transfusion Information  A transfusion is the replacement of blood or some of its parts. Blood is made up of multiple cells which provide different functions.  Red blood cells carry oxygen and are used for blood loss replacement.  White blood cells fight against  infection.  Platelets control bleeding.  Plasma helps clot blood.  Other blood products are available for specialized needs, such as hemophilia or other clotting disorders. BEFORE THE TRANSFUSION  Who gives blood for transfusions?   Healthy volunteers who are fully evaluated to make sure their blood is safe. This is blood bank blood. Transfusion therapy is the safest it has ever been in the practice of medicine. Before blood is taken from a donor, a complete history is taken to make sure that person has no history of diseases nor engages in risky social behavior (examples are intravenous drug use or sexual activity with multiple partners). The donor's travel history is screened to minimize risk of transmitting infections, such as malaria. The donated blood is tested for signs of infectious diseases, such as HIV and hepatitis. The blood is then tested to be sure it is compatible with you in order to minimize the chance of a transfusion reaction. If you or a relative donates blood, this is often done in anticipation of surgery and is not appropriate for emergency situations. It takes many days to process the donated blood. RISKS AND COMPLICATIONS Although transfusion therapy is very safe and saves many lives, the main dangers of transfusion include:   Getting an infectious disease.  Developing a transfusion reaction. This is an allergic reaction to something in the blood you were given. Every precaution is taken to prevent this. The decision to have a blood transfusion has been considered carefully by your caregiver before blood is given. Blood is not given unless the benefits outweigh the risks. AFTER THE TRANSFUSION  Right after receiving a blood transfusion, you will usually feel much better and more energetic. This is especially true if your red blood cells have gotten low (anemic). The transfusion raises the level of the red blood cells which carry oxygen, and this usually causes  an energy  increase.  The nurse administering the transfusion will monitor you carefully for complications. HOME CARE INSTRUCTIONS  No special instructions are needed after a transfusion. You may find your energy is better. Speak with your caregiver about any limitations on activity for underlying diseases you may have. SEEK MEDICAL CARE IF:   Your condition is not improving after your transfusion.  You develop redness or irritation at the intravenous (IV) site. SEEK IMMEDIATE MEDICAL CARE IF:  Any of the following symptoms occur over the next 12 hours:  Shaking chills.  You have a temperature by mouth above 102 F (38.9 C), not controlled by medicine.  Chest, back, or muscle pain.  People around you feel you are not acting correctly or are confused.  Shortness of breath or difficulty breathing.  Dizziness and fainting.  You get a rash or develop hives.  You have a decrease in urine output.  Your urine turns a dark color or changes to pink, red, or brown. Any of the following symptoms occur over the next 10 days:  You have a temperature by mouth above 102 F (38.9 C), not controlled by medicine.  Shortness of breath.  Weakness after normal activity.  The white part of the eye turns yellow (jaundice).  You have a decrease in the amount of urine or are urinating less often.  Your urine turns a dark color or changes to pink, red, or brown. Document Released: 12/10/2000 Document Revised: 03/06/2012 Document Reviewed: 07/29/2008 [facility] Patient Information 2014 [place], [place].  _______________________________________________________________________

## 2018-06-30 ENCOUNTER — Encounter (HOSPITAL_COMMUNITY): Payer: Self-pay

## 2018-06-30 ENCOUNTER — Other Ambulatory Visit: Payer: Self-pay

## 2018-06-30 ENCOUNTER — Encounter (HOSPITAL_COMMUNITY)
Admission: RE | Admit: 2018-06-30 | Discharge: 2018-06-30 | Disposition: A | Discharge status: Home / Self Care | Payer: Medicare PPO | Source: Ambulatory Visit | Attending: Urology | Admitting: Urology

## 2018-06-30 DIAGNOSIS — Z01812 Encounter for preprocedural laboratory examination: Secondary | ICD-10-CM | POA: Insufficient documentation

## 2018-06-30 HISTORY — DX: Influenza due to unidentified influenza virus with other respiratory manifestations: J11.1

## 2018-06-30 HISTORY — DX: Unspecified pterygium of unspecified eye: H11.009

## 2018-06-30 HISTORY — DX: Hyperlipidemia, unspecified: E78.5

## 2018-06-30 HISTORY — DX: Gastro-esophageal reflux disease without esophagitis: K21.9

## 2018-06-30 HISTORY — DX: Diverticulosis of intestine, part unspecified, without perforation or abscess without bleeding: K57.90

## 2018-06-30 HISTORY — DX: Migraine, unspecified, not intractable, without status migrainosus: G43.909

## 2018-06-30 HISTORY — DX: Other microscopic hematuria: R31.29

## 2018-06-30 HISTORY — DX: Anemia, unspecified: D64.9

## 2018-06-30 HISTORY — DX: Personal history of colonic polyps: Z86.010

## 2018-06-30 HISTORY — DX: Pneumonia, unspecified organism: J18.9

## 2018-06-30 HISTORY — DX: Paroxysmal atrial fibrillation: I48.0

## 2018-06-30 HISTORY — DX: Syncope and collapse: R55

## 2018-06-30 HISTORY — DX: Other hemorrhoids: K64.8

## 2018-06-30 HISTORY — DX: Residual hemorrhoidal skin tags: K64.4

## 2018-06-30 HISTORY — DX: Uterovaginal prolapse, unspecified: N81.4

## 2018-06-30 HISTORY — DX: Age-related osteoporosis without current pathological fracture: M81.0

## 2018-06-30 HISTORY — DX: Other intervertebral disc degeneration, lumbar region: M51.36

## 2018-06-30 LAB — TYPE AND SCREEN
ABO/RH(D): O POS
ANTIBODY SCREEN: NEGATIVE

## 2018-06-30 LAB — CBC
HEMATOCRIT: 43.3 % (ref 36.0–46.0)
HEMOGLOBIN: 14.1 g/dL (ref 12.0–15.0)
MCH: 30.2 pg (ref 26.0–34.0)
MCHC: 32.6 g/dL (ref 30.0–36.0)
MCV: 92.7 fL (ref 78.0–100.0)
Platelets: 276 10*3/uL (ref 150–400)
RBC: 4.67 MIL/uL (ref 3.87–5.11)
RDW: 12.8 % (ref 11.5–15.5)
WBC: 4.6 10*3/uL (ref 4.0–10.5)

## 2018-06-30 LAB — BASIC METABOLIC PANEL
ANION GAP: 6 (ref 5–15)
BUN: 15 mg/dL (ref 8–23)
CO2: 27 mmol/L (ref 22–32)
Calcium: 9.2 mg/dL (ref 8.9–10.3)
Chloride: 109 mmol/L (ref 98–111)
Creatinine, Ser: 0.75 mg/dL (ref 0.44–1.00)
Glucose, Bld: 101 mg/dL — ABNORMAL HIGH (ref 70–99)
POTASSIUM: 5 mmol/L (ref 3.5–5.1)
SODIUM: 142 mmol/L (ref 135–145)

## 2018-06-30 LAB — PROTIME-INR
INR: 0.94
Prothrombin Time: 12.5 seconds (ref 11.4–15.2)

## 2018-06-30 LAB — ABO/RH: ABO/RH(D): O POS

## 2018-06-30 NOTE — Pre-Procedure Instructions (Signed)
BMP results 06/30/2018 faxed to Dr. Matilde Sprang via epic.

## 2018-07-07 NOTE — H&P (Signed)
I was consulted by the above provider to assess the patient's prolapse worsening over many years. She had a symptomatic prolapse treated with watchful waiting in 2014. She feels vaginal bulging. She does reduce it. She does splinting for urination. She often asked to reduce the bladder in order to initiate urination. The flow was good and she does feel empty. She has had a hysterectomy.   She is continent. She can void approximately every 2 hours. Sometimes she gets up once a night. She hopes to be sexually active in the future.   On pelvic examination the patient had significant vault prolapse with vaginal cuff exiting the introitus about 2 or 3 cm. She lost nearly all of her length anteriorly and she only had 3 cm in length posteriorly at full descensus. with the posterior wall reduce she had a moderate central defect and cystocele. She had mild hypermobility the bladder neck and negative cough test. With the anterior vault reduced she had a grade 1 rectocele at the level of the mid vaginal vault that would not need repair   The patient has minimal voiding dysfunction and mild nocturia and frequency. She she has prolapse symptoms. She has flow symptoms likely related to the prolapse. The role of urodynamics discussed. if the patient ever had surgery she likely would best benefit from a transvaginal vault suspension with cystocele repair and graft. Natural history of prolapse discussed. She is very active and I agree with her that it would be likely best to treat her at this stage. I will send a note the above provider. During her urodynamics it will be interesting to see if her flow improves with the cystocele reduced   Today  Frequency stable. Flow stable  on urodynamics the patient did not void and was catheterized for 50 mL. Maximum bladder capacity was 555 mL. Bladder was stable. No leakage noted with pressures as high as 167 cm of water with the prolapse reduced and without the prolapse reduced.  During involuntary voiding she voided 550 mL with a maximum flow of 33 mL/sec. Maximum voiding pressure of 21 cm of water. He empty fistulae. EMG activity increased during the voiding phase. Bladder neck descent 3 cm. She had a large cystocele noted. The details of the urodynamics are son dictated   Picture drawn. I discussed a transvaginal vault suspension with cystocele repair and graft. I discussed watchful waiting and pessary. de Novo stress incontinence in severity and sequelae discussed. It appears she likely will void well after surgery but I could not promise this and this was discussed. She did have some difficulty Voiding in the lab setting in the vaginal pack actually had to be removed for her to void.     ALLERGIES: Aspirin TABS Codeine Derivatives Midol CAPS    MEDICATIONS: Aspirin Ec 81 mg tablet, delayed release Oral  Cinnamon 500 mg capsule Oral  Ginger Root 550 mg capsule Oral  Glucosamine Sulfate 500 mg capsule Oral  Multivitamins tablet Oral  Vitamin B-12 1,000 mcg capsule Oral  Vitamin C 500 mg tablet Oral  Vitamin D3 50,000 unit capsule Oral     GU PSH: Complex cystometrogram, w/ void pressure and urethral pressure profile studies, any technique - 06/01/2018 Complex Uroflow - 06/01/2018 Emg surf Electrd - 06/01/2018 Hysterectomy Unilat SO - 2014 Inject For cystogram - 06/01/2018 Intrabd voidng Press - 06/01/2018      PSH Notes: Cholecystectomy, Hysterectomy   NON-GU PSH: Cholecystectomy (open) - 2014    GU PMH: Nocturia (Stable) -  05/16/2018, Nocturia, - 2014 Urinary Frequency - 05/16/2018 Cystocele, midline, Cystocele, midline - 2014 Other microscopic hematuria, Microscopic hematuria - 2014 Rectocele, Rectocele, female - 2014    NON-GU PMH: Encounter for general adult medical examination without abnormal findings, Encounter for preventive health examination - 2014 Personal history of other diseases of the digestive system, History of esophageal reflux -  2014 Personal history of other diseases of the musculoskeletal system and connective tissue, History of fibromyalgia - 2014, History of arthritis, - 2014 Fibromyalgia    FAMILY HISTORY: 1 Daughter - Other 1 son - Other Cancer - Father, Aunt, Sister, Cousin Diabetes - Runs In Family father deceased - Other Heart problem - Runs in Family, Aunt, Mother Kidney Failure - Mother malignant neoplasm - Runs In Family malignant neoplasm of prostate - Runs In Family mother deceased - Other Prostate Cancer - Father Strokes - Runs In Family    Notes: mother died from heart and kidney failure, Father died from Cancer   SOCIAL HISTORY: Marital Status: Married Current Smoking Status: Patient has never smoked.   Tobacco Use Assessment Completed: Used Tobacco in last 30 days? Drinks 3 drinks per month.  Drinks 1 caffeinated drink per day. 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     Notes: Death in the family, father, Self-employed, Daily caffeine consumption, 2-3 servings a day, Alcohol use, Two children, Non-smoker   REVIEW OF SYSTEMS:    GU Review Female:   Patient denies frequent urination, hard to postpone urination, burning /pain with urination, get up at night to urinate, leakage of urine, stream starts and stops, trouble starting your stream, have to strain to urinate, and being pregnant.  Gastrointestinal (Upper):   Patient denies nausea, vomiting, and indigestion/ heartburn.  Gastrointestinal (Lower):   Patient denies diarrhea and constipation.  Constitutional:   Patient denies fever, night sweats, weight loss, and fatigue.  Skin:   Patient denies skin rash/ lesion and itching.  Eyes:   Patient denies blurred vision and double vision.  Ears/ Nose/ Throat:   Patient denies sore throat and sinus problems.  Hematologic/Lymphatic:   Patient denies swollen glands and easy bruising.  Cardiovascular:   Patient denies leg swelling and chest pains.  Respiratory:   Patient denies cough  and shortness of breath.  Endocrine:   Patient denies excessive thirst.  Musculoskeletal:   Patient denies back pain and joint pain.  Neurological:   Patient denies headaches and dizziness.  Psychologic:   Patient denies depression and anxiety.   VITAL SIGNS: None   PAST DATA REVIEWED:  Source Of History:  Patient   PROCEDURES:          Urinalysis Dipstick Dipstick Cont'd  Color: Yellow Bilirubin: Neg  Appearance: Clear Ketones: Neg  Specific Gravity: 1.025 Blood: Neg  pH: 6.5 Protein: Trace  Glucose: Neg Urobilinogen: 0.2    Nitrites: Neg    Leukocyte Esterase: Neg    ASSESSMENT:      ICD-10 Details  1 GU:   Cystocele, midline - N81.11   2   Urinary Frequency - R35.0               Notes:   I drew her a picture and we talked about prolapse surgery in detail. Pros, cons, general surgical and anesthetic risks, and other options including behavioral therapy, pessaries, and watchful waiting were discussed. She understands that prolapse repairs are successful in 80-85% of cases for prolapse symptoms and can recur anteriorly, posteriorly, and/or apically. She understands that in most cases  I use a graft and general risks were discussed. Surgical risks were described but not limited to the discussion of injury to neighboring structures including the bowel (with possible life-threatening sepsis and colostomy), bladder, urethra, vagina (all resulting in further surgery), and ureter (resulting in re-implantation). We talked about injury to nerves/soft tissue leading to debilitating and intractable pelvic, abdominal, and lower extremity pain syndromes and neuropathies. The risks of buttock pain, intractable dyspareunia, and vaginal narrowing and shortening with sequelae were discussed. Bleeding risks, transfusion rates, and infection were discussed. The risk of persistent, de novo, or worsening bladder and/or bowel incontinence/dysfunction was discussed. The need for CIC was described as well the  usual post-operative course. The patient understands that she might not reach her treatment goal and that she might be worse following surgery.    PLAN:           Schedule Return Visit/Planned Activity: Return PRN - Office Visit   After a thorough review of the management options for the patient's condition the patient  elected to proceed with surgical therapy as noted above. We have discussed the potential benefits and risks of the procedure, side effects of the proposed treatment, the likelihood of the patient achieving the goals of the procedure, and any potential problems that might occur during the procedure or recuperation. Informed consent has been obtained.

## 2018-07-11 ENCOUNTER — Encounter (HOSPITAL_COMMUNITY): Payer: Self-pay | Admitting: *Deleted

## 2018-07-11 ENCOUNTER — Ambulatory Visit (HOSPITAL_COMMUNITY): Payer: Medicare PPO | Admitting: Certified Registered Nurse Anesthetist

## 2018-07-11 ENCOUNTER — Encounter (HOSPITAL_COMMUNITY)
Admission: RE | Disposition: A | Discharge status: Home / Self Care | Payer: Self-pay | Source: Ambulatory Visit | Attending: Urology

## 2018-07-11 ENCOUNTER — Other Ambulatory Visit: Payer: Self-pay

## 2018-07-11 ENCOUNTER — Observation Stay (HOSPITAL_COMMUNITY)
Admission: RE | Admit: 2018-07-11 | Discharge: 2018-07-12 | Disposition: A | Discharge status: Home / Self Care | Payer: Medicare PPO | Source: Ambulatory Visit | Attending: Urology | Admitting: Urology

## 2018-07-11 DIAGNOSIS — N8111 Cystocele, midline: Secondary | ICD-10-CM | POA: Diagnosis not present

## 2018-07-11 DIAGNOSIS — Z823 Family history of stroke: Secondary | ICD-10-CM | POA: Diagnosis not present

## 2018-07-11 DIAGNOSIS — Z7982 Long term (current) use of aspirin: Secondary | ICD-10-CM | POA: Insufficient documentation

## 2018-07-11 DIAGNOSIS — Z886 Allergy status to analgesic agent status: Secondary | ICD-10-CM | POA: Insufficient documentation

## 2018-07-11 DIAGNOSIS — Z885 Allergy status to narcotic agent status: Secondary | ICD-10-CM | POA: Insufficient documentation

## 2018-07-11 DIAGNOSIS — Z90710 Acquired absence of both cervix and uterus: Secondary | ICD-10-CM | POA: Diagnosis not present

## 2018-07-11 DIAGNOSIS — Z8249 Family history of ischemic heart disease and other diseases of the circulatory system: Secondary | ICD-10-CM | POA: Diagnosis not present

## 2018-07-11 DIAGNOSIS — N815 Vaginal enterocele: Secondary | ICD-10-CM | POA: Insufficient documentation

## 2018-07-11 DIAGNOSIS — K219 Gastro-esophageal reflux disease without esophagitis: Secondary | ICD-10-CM | POA: Insufficient documentation

## 2018-07-11 DIAGNOSIS — N814 Uterovaginal prolapse, unspecified: Secondary | ICD-10-CM | POA: Diagnosis present

## 2018-07-11 DIAGNOSIS — R350 Frequency of micturition: Secondary | ICD-10-CM | POA: Diagnosis not present

## 2018-07-11 HISTORY — PX: VAGINAL PROLAPSE REPAIR: SHX830

## 2018-07-11 HISTORY — PX: CYSTOCELE REPAIR: SHX163

## 2018-07-11 LAB — HEMOGLOBIN AND HEMATOCRIT, BLOOD
HEMATOCRIT: 40.9 % (ref 36.0–46.0)
Hemoglobin: 13.2 g/dL (ref 12.0–15.0)

## 2018-07-11 SURGERY — COLPORRHAPHY, ANTERIOR, FOR CYSTOCELE REPAIR
Anesthesia: General

## 2018-07-11 MED ORDER — HYDROMORPHONE HCL 1 MG/ML IJ SOLN
INTRAMUSCULAR | Status: AC
  Filled 2018-07-11: qty 1

## 2018-07-11 MED ORDER — SUGAMMADEX SODIUM 200 MG/2ML IV SOLN
INTRAVENOUS | Status: DC | PRN
  Administered 2018-07-11: 150 mg via INTRAVENOUS

## 2018-07-11 MED ORDER — LIDOCAINE 2% (20 MG/ML) 5 ML SYRINGE
INTRAMUSCULAR | Status: AC
  Filled 2018-07-11: qty 5

## 2018-07-11 MED ORDER — LATANOPROST 0.005 % OP SOLN
1.0000 [drp] | Freq: Every day | OPHTHALMIC | Status: DC
  Administered 2018-07-11: 1 [drp] via OPHTHALMIC
  Filled 2018-07-11: qty 2.5

## 2018-07-11 MED ORDER — PROPOFOL 10 MG/ML IV BOLUS
INTRAVENOUS | Status: DC | PRN
  Administered 2018-07-11: 100 mg via INTRAVENOUS

## 2018-07-11 MED ORDER — DEXAMETHASONE SODIUM PHOSPHATE 4 MG/ML IJ SOLN
INTRAMUSCULAR | Status: DC | PRN
  Administered 2018-07-11: 10 mg via INTRAVENOUS

## 2018-07-11 MED ORDER — LIDOCAINE-EPINEPHRINE (PF) 1 %-1:200000 IJ SOLN
INTRAMUSCULAR | Status: AC
  Filled 2018-07-11: qty 60

## 2018-07-11 MED ORDER — PROPOFOL 10 MG/ML IV BOLUS
INTRAVENOUS | Status: AC
  Filled 2018-07-11: qty 20

## 2018-07-11 MED ORDER — DEXTROSE-NACL 5-0.45 % IV SOLN
INTRAVENOUS | Status: DC
  Administered 2018-07-11 – 2018-07-12 (×2): via INTRAVENOUS

## 2018-07-11 MED ORDER — LIDOCAINE 2% (20 MG/ML) 5 ML SYRINGE
INTRAMUSCULAR | Status: DC | PRN
  Administered 2018-07-11: 80 mg via INTRAVENOUS

## 2018-07-11 MED ORDER — CLINDAMYCIN PHOSPHATE 2 % VA CREA
TOPICAL_CREAM | VAGINAL | Status: DC | PRN
  Administered 2018-07-11: 1 via VAGINAL

## 2018-07-11 MED ORDER — FENTANYL CITRATE (PF) 100 MCG/2ML IJ SOLN
INTRAMUSCULAR | Status: DC | PRN
  Administered 2018-07-11 (×5): 50 ug via INTRAVENOUS

## 2018-07-11 MED ORDER — MIDAZOLAM HCL 2 MG/2ML IJ SOLN
INTRAMUSCULAR | Status: AC
  Filled 2018-07-11: qty 2

## 2018-07-11 MED ORDER — ONDANSETRON HCL 4 MG/2ML IJ SOLN
INTRAMUSCULAR | Status: DC | PRN
  Administered 2018-07-11: 4 mg via INTRAVENOUS

## 2018-07-11 MED ORDER — PHENYLEPHRINE 40 MCG/ML (10ML) SYRINGE FOR IV PUSH (FOR BLOOD PRESSURE SUPPORT)
PREFILLED_SYRINGE | INTRAVENOUS | Status: AC
  Filled 2018-07-11: qty 10

## 2018-07-11 MED ORDER — TRAVOPROST 0.004 % OP SOLN: 1.0000 [drp] | Freq: Every day | OPHTHALMIC | Status: DC

## 2018-07-11 MED ORDER — SUGAMMADEX SODIUM 200 MG/2ML IV SOLN
INTRAVENOUS | Status: AC
  Filled 2018-07-11: qty 2

## 2018-07-11 MED ORDER — FLUORESCEIN SODIUM 10 % IV SOLN
INTRAVENOUS | Status: AC
  Filled 2018-07-11: qty 5

## 2018-07-11 MED ORDER — ROCURONIUM BROMIDE 50 MG/5ML IV SOSY
PREFILLED_SYRINGE | INTRAVENOUS | Status: DC | PRN
  Administered 2018-07-11: 60 mg via INTRAVENOUS

## 2018-07-11 MED ORDER — SODIUM CHLORIDE 0.9 % IV SOLN
INTRAVENOUS | Status: DC | PRN
  Administered 2018-07-11: 500 mL

## 2018-07-11 MED ORDER — ONDANSETRON HCL 4 MG/2ML IJ SOLN
INTRAMUSCULAR | Status: AC
  Filled 2018-07-11: qty 2

## 2018-07-11 MED ORDER — FENTANYL CITRATE (PF) 250 MCG/5ML IJ SOLN
INTRAMUSCULAR | Status: AC
  Filled 2018-07-11: qty 5

## 2018-07-11 MED ORDER — CLINDAMYCIN PHOSPHATE 900 MG/50ML IV SOLN
900.0000 mg | INTRAVENOUS | Status: AC
  Administered 2018-07-11: 900 mg via INTRAVENOUS
  Filled 2018-07-11: qty 50

## 2018-07-11 MED ORDER — MEPERIDINE HCL 50 MG/ML IJ SOLN: 6.2500 mg | INTRAMUSCULAR | Status: DC | PRN

## 2018-07-11 MED ORDER — STERILE WATER FOR IRRIGATION IR SOLN
Status: DC | PRN
  Administered 2018-07-11: 150 mL

## 2018-07-11 MED ORDER — DIPHENHYDRAMINE HCL 50 MG/ML IJ SOLN: 12.5000 mg | Freq: Four times a day (QID) | INTRAMUSCULAR | Status: DC | PRN

## 2018-07-11 MED ORDER — PHENAZOPYRIDINE HCL 200 MG PO TABS
200.0000 mg | ORAL_TABLET | ORAL | Status: AC
  Administered 2018-07-11: 200 mg via ORAL
  Filled 2018-07-11: qty 1

## 2018-07-11 MED ORDER — CIPROFLOXACIN IN D5W 400 MG/200ML IV SOLN
400.0000 mg | INTRAVENOUS | Status: AC
  Administered 2018-07-11: 400 mg via INTRAVENOUS
  Filled 2018-07-11: qty 200

## 2018-07-11 MED ORDER — ONDANSETRON HCL 4 MG/2ML IJ SOLN
4.0000 mg | INTRAMUSCULAR | Status: DC | PRN
  Administered 2018-07-11: 4 mg via INTRAVENOUS
  Filled 2018-07-11: qty 2

## 2018-07-11 MED ORDER — DEXAMETHASONE SODIUM PHOSPHATE 10 MG/ML IJ SOLN
INTRAMUSCULAR | Status: AC
  Filled 2018-07-11: qty 1

## 2018-07-11 MED ORDER — LIDOCAINE-EPINEPHRINE (PF) 1 %-1:200000 IJ SOLN
INTRAMUSCULAR | Status: DC | PRN
  Administered 2018-07-11: 20 mL

## 2018-07-11 MED ORDER — DIPHENHYDRAMINE HCL 12.5 MG/5ML PO ELIX: 12.5000 mg | ORAL_SOLUTION | Freq: Four times a day (QID) | ORAL | Status: DC | PRN

## 2018-07-11 MED ORDER — ONDANSETRON HCL 4 MG/2ML IJ SOLN: 4.0000 mg | Freq: Once | INTRAMUSCULAR | Status: DC | PRN

## 2018-07-11 MED ORDER — EPHEDRINE SULFATE 50 MG/ML IJ SOLN
INTRAMUSCULAR | Status: DC | PRN
  Administered 2018-07-11 (×4): 5 mg via INTRAVENOUS
  Administered 2018-07-11: 10 mg via INTRAVENOUS
  Administered 2018-07-11 (×3): 5 mg via INTRAVENOUS

## 2018-07-11 MED ORDER — PHENYLEPHRINE 40 MCG/ML (10ML) SYRINGE FOR IV PUSH (FOR BLOOD PRESSURE SUPPORT)
PREFILLED_SYRINGE | INTRAVENOUS | Status: DC | PRN
  Administered 2018-07-11: 40 ug via INTRAVENOUS
  Administered 2018-07-11: 80 ug via INTRAVENOUS
  Administered 2018-07-11: 40 ug via INTRAVENOUS
  Administered 2018-07-11 (×2): 80 ug via INTRAVENOUS
  Administered 2018-07-11 (×2): 40 ug via INTRAVENOUS
  Administered 2018-07-11 (×2): 80 ug via INTRAVENOUS
  Administered 2018-07-11: 40 ug via INTRAVENOUS

## 2018-07-11 MED ORDER — EPHEDRINE 5 MG/ML INJ
INTRAVENOUS | Status: AC
  Filled 2018-07-11: qty 10

## 2018-07-11 MED ORDER — ROCURONIUM BROMIDE 10 MG/ML (PF) SYRINGE
PREFILLED_SYRINGE | INTRAVENOUS | Status: AC
  Filled 2018-07-11: qty 10

## 2018-07-11 MED ORDER — MORPHINE SULFATE (PF) 2 MG/ML IV SOLN
2.0000 mg | INTRAVENOUS | Status: DC | PRN
  Administered 2018-07-11 – 2018-07-12 (×3): 2 mg via INTRAVENOUS
  Filled 2018-07-11 (×3): qty 1

## 2018-07-11 MED ORDER — HYDROMORPHONE HCL 1 MG/ML IJ SOLN
0.2500 mg | INTRAMUSCULAR | Status: DC | PRN
  Administered 2018-07-11 (×3): 0.5 mg via INTRAVENOUS

## 2018-07-11 MED ORDER — TRAMADOL HCL 50 MG PO TABS
50.0000 mg | ORAL_TABLET | Freq: Four times a day (QID) | ORAL | Status: DC | PRN
  Administered 2018-07-11: 50 mg via ORAL
  Filled 2018-07-11: qty 1

## 2018-07-11 MED ORDER — CALCIUM CARBONATE ANTACID 500 MG PO CHEW
2.0000 | CHEWABLE_TABLET | Freq: Once | ORAL | Status: AC
  Administered 2018-07-11: 200 mg via ORAL
  Filled 2018-07-11: qty 2

## 2018-07-11 MED ORDER — ACETAMINOPHEN 325 MG PO TABS: 650.0000 mg | ORAL_TABLET | ORAL | Status: DC | PRN

## 2018-07-11 MED ORDER — LACTATED RINGERS IV SOLN
INTRAVENOUS | Status: DC
  Administered 2018-07-11 (×2): via INTRAVENOUS

## 2018-07-11 MED ORDER — SODIUM CHLORIDE 0.9 % IV SOLN
INTRAVENOUS | Status: AC
  Filled 2018-07-11: qty 500000

## 2018-07-11 MED ORDER — TRAMADOL HCL 50 MG PO TABS: 50.0000 mg | ORAL_TABLET | Freq: Four times a day (QID) | ORAL | 0 refills | Status: DC | PRN

## 2018-07-11 MED ORDER — CLINDAMYCIN PHOSPHATE 2 % VA CREA
TOPICAL_CREAM | VAGINAL | Status: AC
  Filled 2018-07-11: qty 80

## 2018-07-11 SURGICAL SUPPLY — 43 items
ALLOGRAFT TUTOPLAST AXIS 6X12 (Tissue) ×1 IMPLANT
BAG URINE DRAINAGE (UROLOGICAL SUPPLIES) ×3 IMPLANT
BLADE SURG 15 STRL LF DISP TIS (BLADE) ×1 IMPLANT
BLADE SURG 15 STRL SS (BLADE) ×2
CATH FOLEY 2WAY SLVR  5CC 14FR (CATHETERS) ×2
CATH FOLEY 2WAY SLVR 5CC 14FR (CATHETERS) ×1 IMPLANT
COVER MAYO STAND STRL (DRAPES) IMPLANT
DEVICE CAPIO SLIM SINGLE (INSTRUMENTS) ×3 IMPLANT
DRAIN PENROSE 18X1/4 LTX STRL (WOUND CARE) ×3 IMPLANT
DRAPE SHEET LG 3/4 BI-LAMINATE (DRAPES) ×3 IMPLANT
ELECT PENCIL ROCKER SW 15FT (MISCELLANEOUS) ×3 IMPLANT
GAUZE 4X4 16PLY RFD (DISPOSABLE) ×6 IMPLANT
GAUZE PACKING 2X5 YD STRL (GAUZE/BANDAGES/DRESSINGS) ×3 IMPLANT
GLOVE BIO SURGEON STRL SZ 6.5 (GLOVE) ×2 IMPLANT
GLOVE BIO SURGEONS STRL SZ 6.5 (GLOVE) ×1
GLOVE BIOGEL M STRL SZ7.5 (GLOVE) ×3 IMPLANT
GLOVE ECLIPSE 8.5 STRL (GLOVE) ×3 IMPLANT
GOWN STRL REUS W/TWL XL LVL3 (GOWN DISPOSABLE) ×3 IMPLANT
HOLDER FOLEY CATH W/STRAP (MISCELLANEOUS) ×3 IMPLANT
IV NS 1000ML (IV SOLUTION) ×2
IV NS 1000ML BAXH (IV SOLUTION) ×1 IMPLANT
KIT BASIN OR (CUSTOM PROCEDURE TRAY) ×3 IMPLANT
NEEDLE HYPO 22GX1.5 SAFETY (NEEDLE) ×3 IMPLANT
NEEDLE MAYO 6 CRC TAPER PT (NEEDLE) ×3 IMPLANT
NS IRRIG 1000ML POUR BTL (IV SOLUTION) ×3 IMPLANT
PACK CYSTO (CUSTOM PROCEDURE TRAY) ×3 IMPLANT
PLUG CATH AND CAP STER (CATHETERS) ×3 IMPLANT
RETRACTOR STAY HOOK 5MM (MISCELLANEOUS) ×3 IMPLANT
SHEET LAVH (DRAPES) ×3 IMPLANT
SUT CAPIO ETHIBPND (SUTURE) ×6 IMPLANT
SUT VIC AB 0 CT1 27 (SUTURE) ×4
SUT VIC AB 0 CT1 27XBRD ANTBC (SUTURE) ×2 IMPLANT
SUT VIC AB 2-0 SH 27 (SUTURE) ×4
SUT VIC AB 2-0 SH 27X BRD (SUTURE) ×2 IMPLANT
SUT VICRYL 0 UR6 27IN ABS (SUTURE) ×6 IMPLANT
SYR 10ML LL (SYRINGE) ×3 IMPLANT
TOWEL OR 17X26 10 PK STRL BLUE (TOWEL DISPOSABLE) ×3 IMPLANT
TOWEL OR NON WOVEN STRL DISP B (DISPOSABLE) ×3 IMPLANT
TUBING CONNECTING 10 (TUBING) ×2 IMPLANT
TUBING CONNECTING 10' (TUBING) ×1
TUTOPLAST AXIS 6X12 (Tissue) ×3 IMPLANT
WATER STERILE IRR 1000ML POUR (IV SOLUTION) ×3 IMPLANT
YANKAUER SUCT BULB TIP 10FT TU (MISCELLANEOUS) ×3 IMPLANT

## 2018-07-11 NOTE — Anesthesia Preprocedure Evaluation (Signed)
Anesthesia Evaluation  Patient identified by MRN, date of birth, ID band Patient awake    Reviewed: Allergy & Precautions, NPO status , Patient's Chart, lab work & pertinent test results  Airway Mallampati: I  TM Distance: >3 FB Neck ROM: Full    Dental   Pulmonary    Pulmonary exam normal        Cardiovascular Normal cardiovascular exam     Neuro/Psych    GI/Hepatic GERD  Medicated and Controlled,  Endo/Other    Renal/GU      Musculoskeletal   Abdominal   Peds  Hematology   Anesthesia Other Findings   Reproductive/Obstetrics                             Anesthesia Physical Anesthesia Plan  ASA: II  Anesthesia Plan: General   Post-op Pain Management:    Induction: Intravenous  PONV Risk Score and Plan: 3 and Ondansetron, Midazolam and Dexamethasone  Airway Management Planned: Oral ETT  Additional Equipment:   Intra-op Plan:   Post-operative Plan: Extubation in OR  Informed Consent: I have reviewed the patients History and Physical, chart, labs and discussed the procedure including the risks, benefits and alternatives for the proposed anesthesia with the patient or authorized representative who has indicated his/her understanding and acceptance.       Plan Discussed with: CRNA and Surgeon  Anesthesia Plan Comments:         Anesthesia Quick Evaluation  

## 2018-07-11 NOTE — Op Note (Signed)
Preoperative diagnosis: Cystocele and vault prolapse Postoperative diagnosis: Cystocele and vault prolapse Surgery: Cystocele repair and graft and vault prolapse and cystoscopy Surgeon: Dr. Nicki Reaper Jaiah Weigel Assistant: Debbrah Alar  The assistant was present and necessary for all steps of the operation described. The assistant played a critical role assisting during the operation  The patient has the above diagnosis and consented to the above procedure.  Extra care was taken with leg positioning to minimize the risk of compartment syndrome and neuropathy and deep vein thrombosis.  Her vaginal cuff descended almost to the introitus under anesthesia.  With the vault and cystocele reduced she had little to no posterior defect.  Preoperative antibiotics were given  I instilled approximately 20 cc of the lidocaine epinephrine mixture.  I made my usual anterior T-shaped incision with my Allis clamp technique.  I mobilized the underlying pubocervical fascia from the overlying vaginal epithelium to the white line bilaterally and was pleased with my mobilization at the apex.  She had more of a trapdoor defect and a central defect.  I did an anterior repair with running 2-0 Vicryl on an SH needle not imbricating the bladder neck.  2 layer repair was performed keeping excellent length and the repair was anatomic  Patient was cystoscoped.  There was a excellent cystocele repair.  There was no distortion of the ureters.  There was excellent efflux bilaterally  Along the appropriate plane I finger dissected to the ischial spine bilaterally.  I mobilized soft tissue medially.  I placed a 0 Ethibond suture 1 fingerbreadth medial to each spine in a straight line between the spines.  This was triple checked.  After I did a rectal examination and the suture was in good position and not in the rectum.  2-0 Vicryl on a UR 6 needle was placed into the pelvic sidewall at the level of the urethrovesical angle bilaterally.   A 10 x 6 fascia lata graft appropriately prepared and cut in the shape of the trapezoid was sewn in place tension free.  I excised an appropriate amount of anterior vaginal wall epithelium and closed anterior vaginal wall with running 0 Vicryl on a CT1 needle.  The patient had excellent length at the end of the case.  She had excellent support anteriorly.  She had no rectocele repair  Blood loss was less than 50 mL.  Leg position was excellent.  Urine output was excellent.  Patient was taken to recovery and hopefully the patient will reach her treatment goal

## 2018-07-11 NOTE — Interval H&P Note (Signed)
History and Physical Interval Note:  07/11/2018 7:55 AM  Alicia Bass  has presented today for surgery, with the diagnosis of CYSTOCELE VAULT PROLAPSE  The various methods of treatment have been discussed with the patient and family. After consideration of risks, benefits and other options for treatment, the patient has consented to  Procedure(s): ANTERIOR REPAIR (CYSTOCELE) (N/A) VAGINAL VAULT SUSPENSION AND GRAFT CYSTOSCOPY (N/A) as a surgical intervention .  The patient's history has been reviewed, patient examined, no change in status, stable for surgery.  I have reviewed the patient's chart and labs.  Questions were answered to the patient's satisfaction.     Quentina Fronek A

## 2018-07-11 NOTE — Anesthesia Postprocedure Evaluation (Signed)
Anesthesia Post Note  Patient: Alicia Bass  Procedure(s) Performed: ANTERIOR REPAIR (CYSTOCELE) (N/A ) VAGINAL VAULT SUSPENSION AND GRAFT CYSTOSCOPY (N/A )     Patient location during evaluation: PACU Anesthesia Type: General Level of consciousness: awake and alert Pain management: pain level controlled Vital Signs Assessment: post-procedure vital signs reviewed and stable Respiratory status: spontaneous breathing, nonlabored ventilation, respiratory function stable and patient connected to nasal cannula oxygen Cardiovascular status: blood pressure returned to baseline and stable Postop Assessment: no apparent nausea or vomiting Anesthetic complications: no    Last Vitals:  Vitals:   07/11/18 1230 07/11/18 1245  BP:  (!) 114/57  Pulse:  74  Resp:    Temp: 36.4 C 36.8 C  SpO2:  92%    Last Pain:  Vitals:   07/11/18 1334  TempSrc:   PainSc: 6                  Torben Soloway DAVID

## 2018-07-11 NOTE — Anesthesia Procedure Notes (Signed)
Procedure Name: Intubation Date/Time: 07/11/2018 8:07 AM Performed by: Deliah Boston, CRNA Pre-anesthesia Checklist: Patient identified, Emergency Drugs available, Suction available and Patient being monitored Patient Re-evaluated:Patient Re-evaluated prior to induction Oxygen Delivery Method: Circle system utilized Preoxygenation: Pre-oxygenation with 100% oxygen Induction Type: IV induction Ventilation: Mask ventilation without difficulty Laryngoscope Size: Mac and 3 Grade View: Grade III Tube type: Oral Tube size: 7.0 mm Number of attempts: 1 Airway Equipment and Method: Stylet and Oral airway Placement Confirmation: ETT inserted through vocal cords under direct vision,  positive ETCO2 and breath sounds checked- equal and bilateral Secured at: 21 cm Tube secured with: Tape Dental Injury: Teeth and Oropharynx as per pre-operative assessment  Difficulty Due To: Difficulty was anticipated and Difficult Airway- due to anterior larynx

## 2018-07-11 NOTE — Progress Notes (Signed)
No pain Op detailed See in am

## 2018-07-11 NOTE — Transfer of Care (Signed)
Immediate Anesthesia Transfer of Care Note  Patient: Alicia Bass  Procedure(s) Performed: Procedure(s): ANTERIOR REPAIR (CYSTOCELE) (N/A) VAGINAL VAULT SUSPENSION AND GRAFT CYSTOSCOPY (N/A)  Patient Location: PACU  Anesthesia Type:General  Level of Consciousness: Patient easily awoken, sedated, comfortable, cooperative, following commands, responds to stimulation.   Airway & Oxygen Therapy: Patient spontaneously breathing, ventilating well, oxygen via simple oxygen mask.  Post-op Assessment: Report given to PACU RN, vital signs reviewed and stable, moving all extremities.   Post vital signs: Reviewed and stable.  Complications: No apparent anesthesia complications  Last Vitals:  Vitals Value Taken Time  BP 120/69 07/11/2018 10:33 AM  Temp    Pulse 99 07/11/2018 10:36 AM  Resp 15 07/11/2018 10:36 AM  SpO2 99 % 07/11/2018 10:36 AM  Vitals shown include unvalidated device data.  Last Pain:  Vitals:   07/11/18 0610  TempSrc: Oral         Complications: No apparent anesthesia complications

## 2018-07-12 ENCOUNTER — Encounter (HOSPITAL_COMMUNITY): Payer: Self-pay | Admitting: Urology

## 2018-07-12 DIAGNOSIS — N8111 Cystocele, midline: Secondary | ICD-10-CM | POA: Diagnosis not present

## 2018-07-12 LAB — BASIC METABOLIC PANEL
ANION GAP: 8 (ref 5–15)
BUN: 10 mg/dL (ref 8–23)
CHLORIDE: 107 mmol/L (ref 98–111)
CO2: 26 mmol/L (ref 22–32)
Calcium: 8.9 mg/dL (ref 8.9–10.3)
Creatinine, Ser: 0.72 mg/dL (ref 0.44–1.00)
Glucose, Bld: 139 mg/dL — ABNORMAL HIGH (ref 70–99)
POTASSIUM: 4.6 mmol/L (ref 3.5–5.1)
SODIUM: 141 mmol/L (ref 135–145)

## 2018-07-12 LAB — HEMOGLOBIN AND HEMATOCRIT, BLOOD
HEMATOCRIT: 37.7 % (ref 36.0–46.0)
HEMOGLOBIN: 12.1 g/dL (ref 12.0–15.0)

## 2018-07-12 NOTE — Progress Notes (Signed)
looks good No pain Labs good Detail post op

## 2018-07-12 NOTE — Discharge Instructions (Signed)
I have reviewed discharge instructions in detail with the patient. They will follow-up with me or their physician as scheduled. My nurse will also be calling the patients as per protocol. As discussed with Dr. Avriel Kandel. ° °You may resume aspirin, advil, aleve, vitamins, and supplements 7 days after surgery. °

## 2018-07-12 NOTE — Discharge Summary (Signed)
Date of admission: 07/11/2018  Date of discharge: 07/12/2018  Admission diagnosis: cystocele  Discharge diagnosis: cystocele  Secondary diagnoses: vault prolapse  History and Physical: For full details, please see admission history and physical. Briefly, Alicia Bass is a 72 y.o. year old patient with above diagnosis.   Hospital Course: cystocele and vault prolapse repair. Excellent post op course  Laboratory values:  Recent Labs    07/11/18 1559 07/12/18 0507  HGB 13.2 12.1  HCT 40.9 37.7   Recent Labs    07/12/18 0507  CREATININE 0.72    Disposition: Home  Discharge instruction: The patient was instructed to be ambulatory but told to refrain from heavy lifting, strenuous activity, or driving. Detailed  Discharge medications:  Allergies as of 07/12/2018      Reactions   Atorvastatin    Severe muscle pain   Ibuprofen    Bothers hiatal hernia   Asa [aspirin] Other (See Comments)   "a whole big aspirin sets off my hiatal hernia." Tolerates low-dose.   Codeine Other (See Comments)   Hallucinations    Midol [aspirin-cinnamedrine-caffeine] Palpitations      Medication List    STOP taking these medications   aspirin 81 MG tablet   CINNAMON PO   Ginger Root 550 MG Caps   Glucosamine Chondroitin Triple Tabs   multivitamin tablet   VISION FORMULA PO   vitamin C 500 MG tablet Commonly known as:  ASCORBIC ACID     TAKE these medications   acetaminophen 500 MG tablet Commonly known as:  TYLENOL Take 1,000 mg by mouth daily as needed for moderate pain.   calcium carbonate 500 MG chewable tablet Commonly known as:  TUMS - dosed in mg elemental calcium Chew 1 tablet by mouth daily as needed for indigestion or heartburn.   latanoprost 0.005 % ophthalmic solution Commonly known as:  XALATAN Place 1 drop into the left eye at bedtime.   LIQUID TEARS OP Place 1 drop into both eyes daily as needed (dry eyes).   loratadine 10 MG tablet Commonly known as:   CLARITIN Take 10 mg by mouth daily as needed for allergies.   traMADol 50 MG tablet Commonly known as:  ULTRAM Take 1-2 tablets (50-100 mg total) by mouth every 6 (six) hours as needed for moderate pain or severe pain.   travoprost (benzalkonium) 0.004 % ophthalmic solution Commonly known as:  TRAVATAN Place 1 drop into the left eye at bedtime.       Followup:  Follow-up Information    Jakai Onofre, Nicki Reaper, MD.   Specialty:  Urology Why:  office will call you with date and time of appt.  Contact information: [place] [address] [phone]

## 2018-09-14 ENCOUNTER — Other Ambulatory Visit: Payer: Self-pay | Admitting: Physician Assistant

## 2018-09-14 DIAGNOSIS — Z1231 Encounter for screening mammogram for malignant neoplasm of breast: Secondary | ICD-10-CM

## 2018-09-24 IMAGING — DX DG CHEST 2V
2 series · 2 of 2 positions shown · non-contrast
Comparison: Report only of a chest x-ray November 12, 2000

CLINICAL DATA: Cough, fever, tachycardia.  Nonsmoker.

EXAM:
CHEST  2 VIEW

[chest pa]
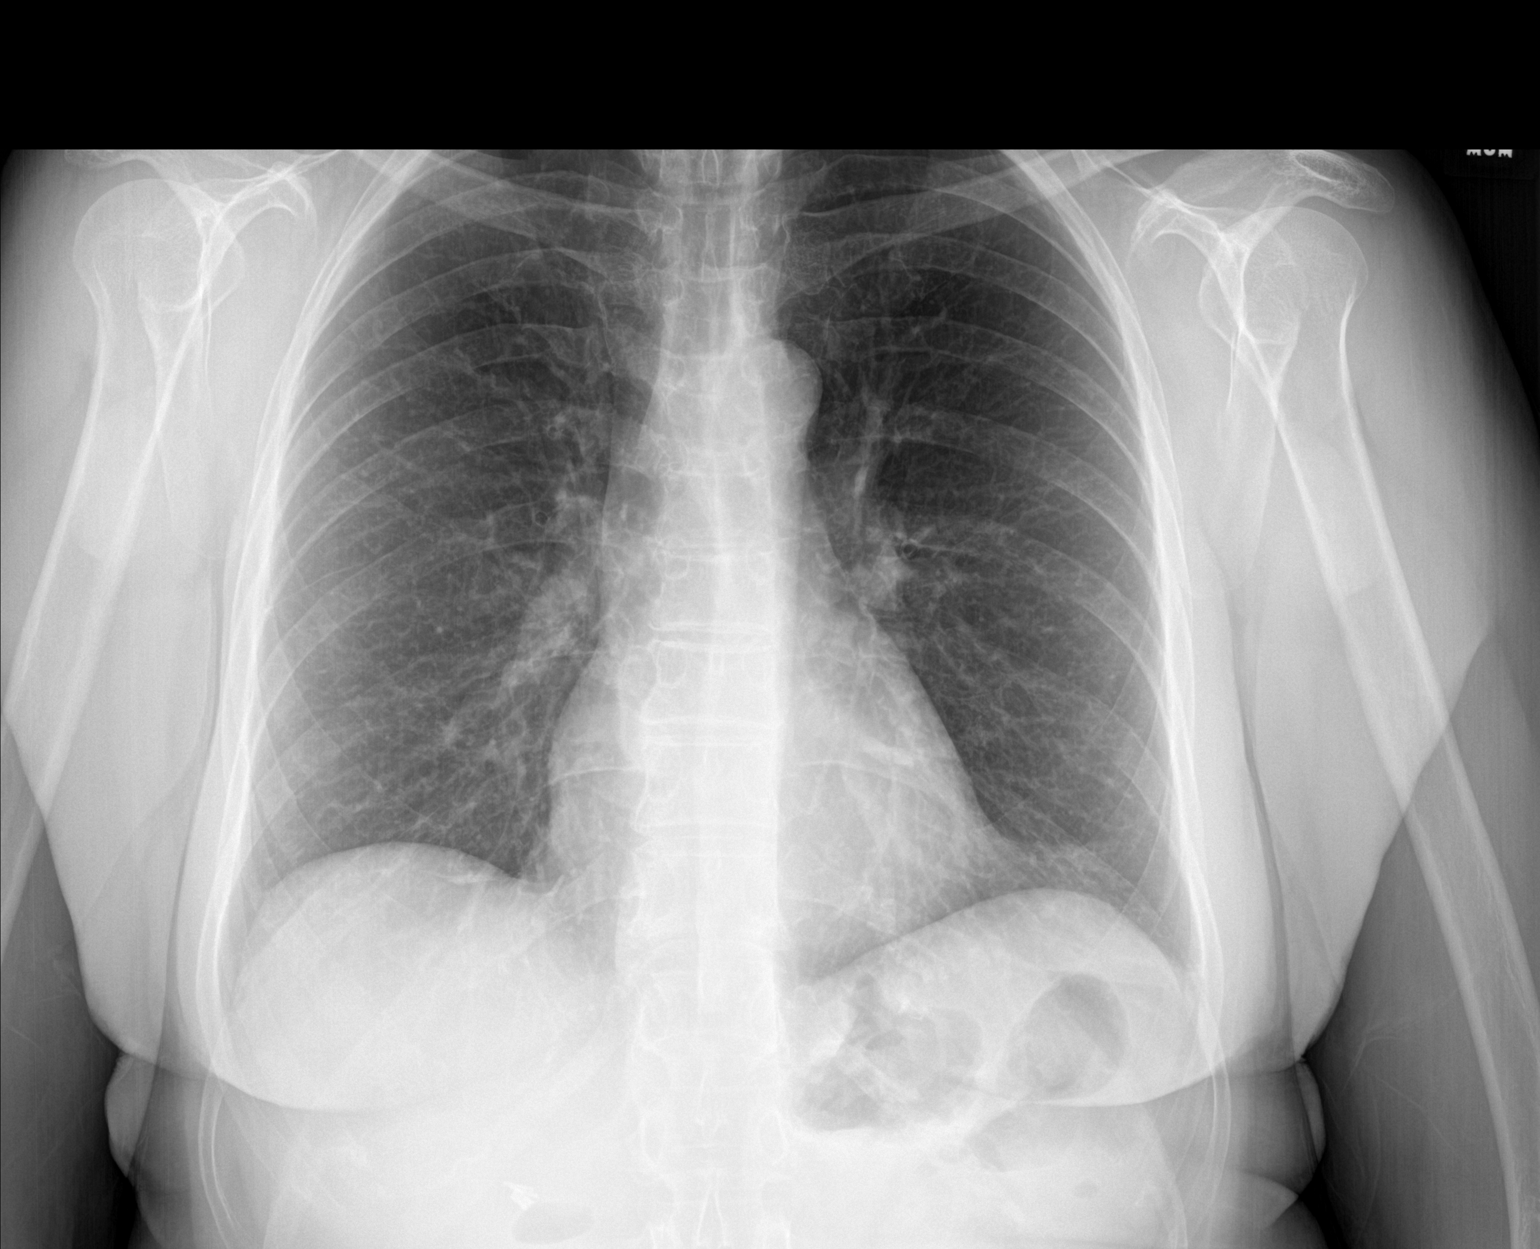

[chest lat]
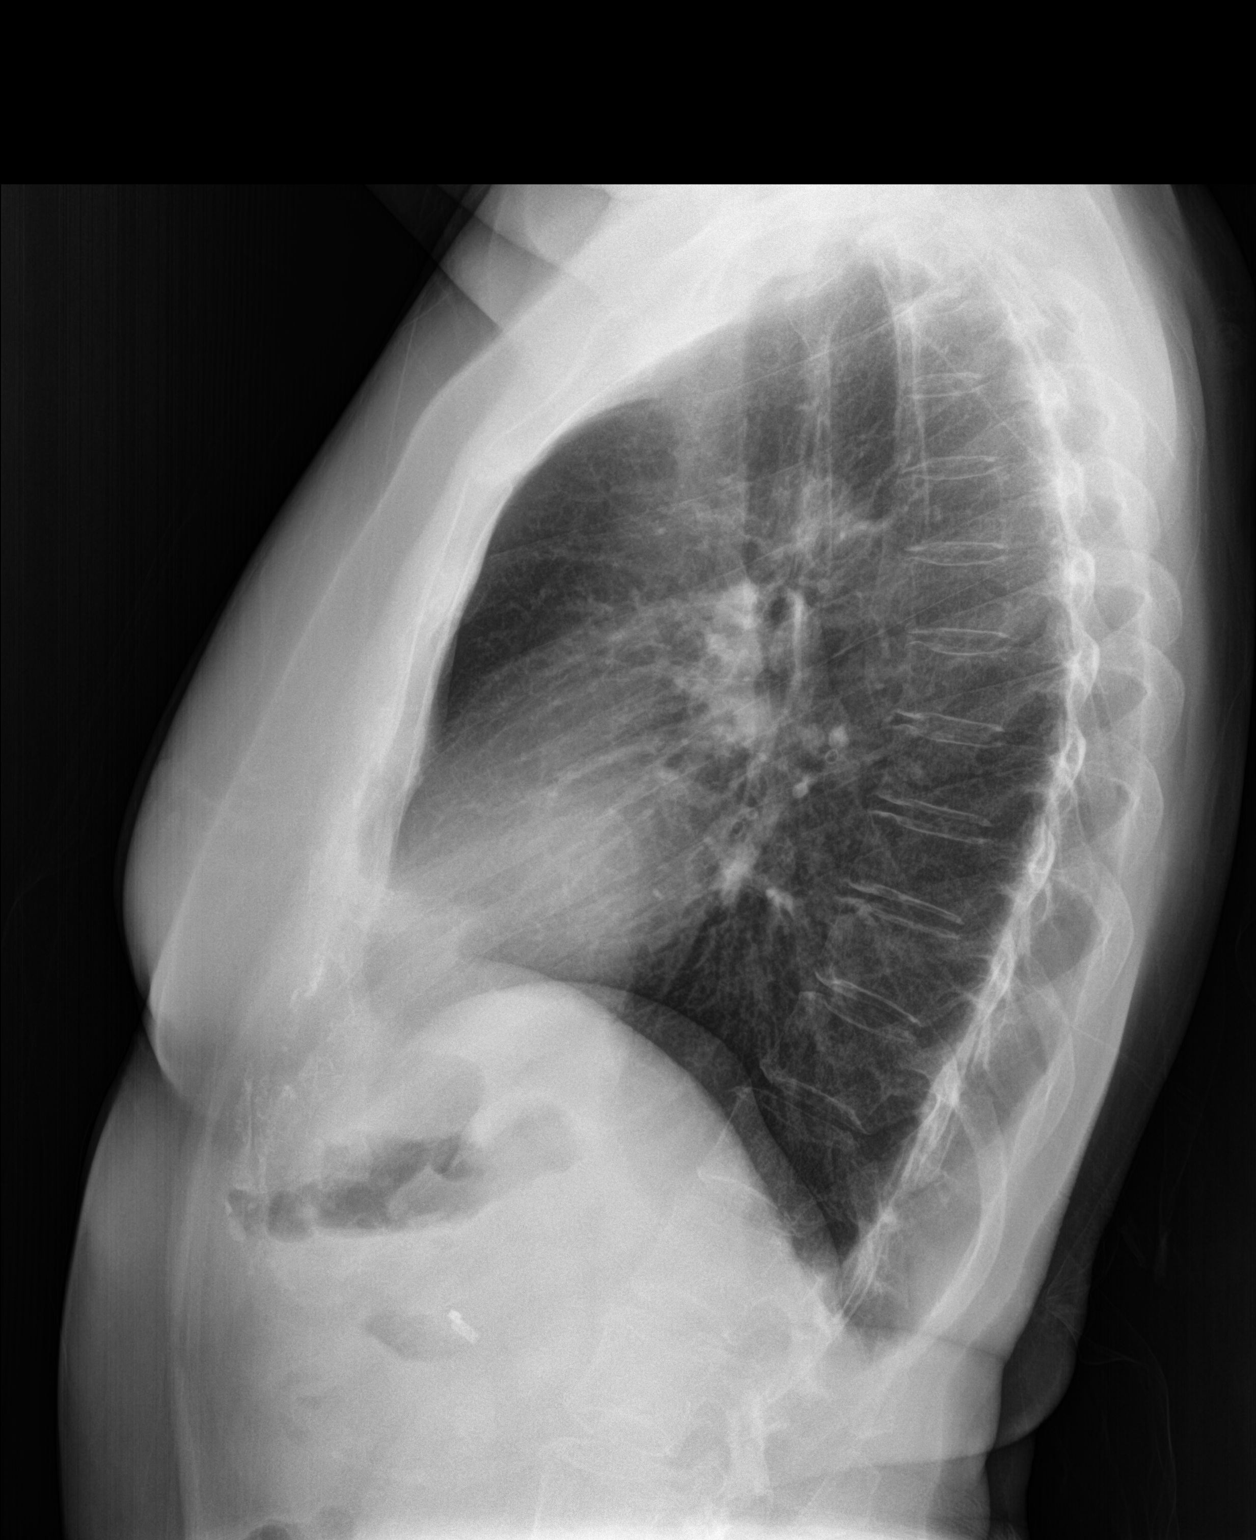

[2 of 2 positions shown; findings below may reference images not displayed]

FINDINGS: The lungs are well-expanded. There is no focal infiltrate. There is
subtle density that projects in the retro sternal region likely in
the lingula that may be acute or old. There is bilateral mild apical
pleural thickening. There is no pleural effusion. The heart and
pulmonary vascularity are normal. The mediastinum is normal in
width. There is no pleural effusion. The bony thorax is
unremarkable.
IMPRESSION: Possible lingular atelectasis or early pneumonia. Otherwise there no
active cardiopulmonary disease.

Given the patient's symptoms and lack of previous images to review,
followup PA and lateral chest X-ray is recommended in 3-4 weeks
following trial of antibiotic therapy to ensure resolution and
exclude underlying malignancy.

## 2018-10-11 ENCOUNTER — Ambulatory Visit
Admission: RE | Admit: 2018-10-11 | Discharge: 2018-10-11 | Disposition: A | Discharge status: Home / Self Care | Payer: Medicare PPO | Source: Ambulatory Visit | Attending: Physician Assistant | Admitting: Physician Assistant

## 2018-10-11 DIAGNOSIS — Z1231 Encounter for screening mammogram for malignant neoplasm of breast: Secondary | ICD-10-CM

## 2018-12-21 ENCOUNTER — Other Ambulatory Visit: Payer: Self-pay | Admitting: Physician Assistant

## 2018-12-21 DIAGNOSIS — M810 Age-related osteoporosis without current pathological fracture: Secondary | ICD-10-CM

## 2018-12-21 DIAGNOSIS — E2839 Other primary ovarian failure: Secondary | ICD-10-CM

## 2019-02-14 ENCOUNTER — Other Ambulatory Visit: Payer: Medicare PPO

## 2019-03-07 ENCOUNTER — Ambulatory Visit
Admission: RE | Admit: 2019-03-07 | Discharge: 2019-03-07 | Disposition: A | Discharge status: Home / Self Care | Payer: Medicare Other | Source: Ambulatory Visit | Attending: Physician Assistant | Admitting: Physician Assistant

## 2019-03-07 ENCOUNTER — Other Ambulatory Visit: Payer: Self-pay

## 2019-03-07 DIAGNOSIS — M810 Age-related osteoporosis without current pathological fracture: Secondary | ICD-10-CM

## 2019-03-07 DIAGNOSIS — E2839 Other primary ovarian failure: Secondary | ICD-10-CM

## 2019-10-23 ENCOUNTER — Other Ambulatory Visit: Payer: Self-pay

## 2019-10-23 ENCOUNTER — Ambulatory Visit
Admission: RE | Admit: 2019-10-23 | Discharge: 2019-10-23 | Disposition: A | Discharge status: Home / Self Care | Payer: Medicare Other | Source: Ambulatory Visit | Attending: Family Medicine | Admitting: Family Medicine

## 2019-10-23 ENCOUNTER — Other Ambulatory Visit: Payer: Self-pay | Admitting: Family Medicine

## 2019-10-23 DIAGNOSIS — Z1231 Encounter for screening mammogram for malignant neoplasm of breast: Secondary | ICD-10-CM

## 2020-02-08 ENCOUNTER — Ambulatory Visit: Payer: Medicare Other | Attending: Internal Medicine

## 2020-02-08 DIAGNOSIS — Z23 Encounter for immunization: Secondary | ICD-10-CM

## 2020-02-08 NOTE — Progress Notes (Signed)
   Covid-19 Vaccination Clinic  Name:  Alicia Bass    MRN: [id number] DOB: Aug 20, 1946  02/08/2020  [name] was observed post Covid-19 immunization for 15 minutes without incidence. She was provided with Vaccine Information Sheet and instruction to access the V-Safe system.   [name] was instructed to call 911 with any severe reactions post vaccine: Marland Kitchen Difficulty breathing  . Swelling of your face and throat  . A fast heartbeat  . A bad rash all over your body  . Dizziness and weakness    Immunizations Administered    Name Date Dose VIS Date Route   Pfizer COVID-19 Vaccine 02/08/2020 12:07 PM 0.3 mL 12/07/2019 Intramuscular   Manufacturer: [place]   Lot: [id number]   [place]: [id number]

## 2020-03-02 ENCOUNTER — Ambulatory Visit: Payer: Medicare Other | Attending: Internal Medicine

## 2020-03-02 DIAGNOSIS — Z23 Encounter for immunization: Secondary | ICD-10-CM

## 2020-03-02 NOTE — Progress Notes (Signed)
   Covid-19 Vaccination Clinic  Name:  Alicia Bass    MRN: [id number] DOB: Nov 08, 1946  03/02/2020  [name] was observed post Covid-19 immunization for 15 minutes without incident. She was provided with Vaccine Information Sheet and instruction to access the V-Safe system.   [name] was instructed to call 911 with any severe reactions post vaccine: Marland Kitchen Difficulty breathing  . Swelling of face and throat  . A fast heartbeat  . A bad rash all over body  . Dizziness and weakness   Immunizations Administered    Name Date Dose VIS Date Route   Pfizer COVID-19 Vaccine 03/02/2020 12:20 PM 0.3 mL 12/07/2019 Intramuscular   Manufacturer: [place]   Lot: [id number]   [place]: [id number]

## 2020-04-27 NOTE — Progress Notes (Signed)
Virtual Visit via Telephone Note   This visit type was conducted due to national recommendations for restrictions regarding the COVID-19 Pandemic (e.g. social distancing) in an effort to limit this patient's exposure and mitigate transmission in our community.  Due to her co-morbid illnesses, this patient is at least at moderate risk for complications without adequate follow up.  This format is felt to be most appropriate for this patient at this time.  The patient did not have access to video technology/had technical difficulties with video requiring transitioning to audio format only (telephone).  All issues noted in this document were discussed and addressed.  No physical exam could be performed with this format.  Please refer to the patient's chart for her  consent to telehealth for [facility].   The patient was identified using 2 identifiers.  Date:  04/28/2020   ID:  Alicia Bass, DOB April 11, 1946, MRN [id number]  Patient Location: Home Provider Location: Other:  hospital  PCP:  Bernerd Limbo, MD  Cardiologist:  Sanda Klein, MD  Electrophysiologist:  None   Evaluation Performed:  Follow-Up Visit  Chief Complaint:  PAF, no recurrence  History of Present Illness:    Alicia Bass is a 74 y.o. female with a history of a single brief episode of atrial fibrillation in the setting of influenza and PNA (11/2016). She spontaneously converted to NSR in the ER and subsequently wore a 30-day monitor which was negative for Afib. Given her low CHADS2VASC score, she discontinued AC and is only on 81 mg ASA. She was last seen in Feb 2018 by Dr. Sallyanne Kuster and was doing well at that time.   She has not been seen in over 3 years and is now treated as a new patient. She is doing well overall. No further episodes of palpitations or fluttering. No chest pain, dyspnea, lower extremity edema, or syncope. No hx of stroke. BP is elevated today, but this is abnormal for her. Her husband just had  knee replacement surgery and she is taking care of him. PCP follows lipid profile. Her brother passed away February 09, 2019. She is fully vaccinated against COVID-19.   Repeat BP was 132/98. She was rushing in from running up the driveway to answer our phone call before the first reading.   The patient does not have symptoms concerning for COVID-19 infection (fever, chills, cough, or new shortness of breath).    Past Medical History:  Diagnosis Date  . Allergy   . Anemia    history of teenager  . Arthritis   . Basal cell carcinoma    LEFT anterior chest wall  . Cystocele with prolapse   . DDD (degenerative disc disease), lumbar 2001   Mild  . Diverticulosis   . Fibromyalgia   . GERD (gastroesophageal reflux disease)   . Hematuria, microscopic   . Hiatal hernia   . History of colon polyps   . Hyperlipidemia    normal as of 06/30/2018  . Influenza 2018   hospitalized  . Internal and external hemorrhoids without complication   . Migraines    history of   . Osteoporosis   . PAF (paroxysmal atrial fibrillation) ([place]) 2018   spontaneous conversion NSR  . Pneumonia   . Pterygium eye   . Shingles 2007  . Syncope    due to blood loss before hysterectomy   Past Surgical History:  Procedure Laterality Date  . ABDOMINAL HYSTERECTOMY    . CERVICAL CONE BIOPSY    . CHOLECYSTECTOMY    .  COLONOSCOPY W/ POLYPECTOMY  12/12/2012  . CYSTOCELE REPAIR N/A 07/11/2018   Procedure: ANTERIOR REPAIR (CYSTOCELE);  Surgeon: Bjorn Loser, MD;  Location: [facility];  Service: Urology;  Laterality: N/A;  . excision     BCC, back  . VAGINAL PROLAPSE REPAIR N/A 07/11/2018   Procedure: VAGINAL VAULT SUSPENSION AND GRAFT CYSTOSCOPY;  Surgeon: Bjorn Loser, MD;  Location: [facility];  Service: Urology;  Laterality: N/A;     Current Meds  Medication Sig  . acetaminophen (TYLENOL) 500 MG tablet Take 1,000 mg by mouth daily as needed for moderate pain.  Marland Kitchen alendronate (FOSAMAX) 70 MG tablet Take 70 mg by  mouth once a week. Take with a full glass of water on an empty stomach.  . Ascorbic Acid (VITAMIN C PO) Take by mouth.  . Aspirin Buf,CaCarb-MgCarb-MgO, 81 MG TABS Take by mouth.  . calcium carbonate (TUMS - DOSED IN MG ELEMENTAL CALCIUM) 500 MG chewable tablet Chew 1 tablet by mouth daily as needed for indigestion or heartburn.  Marland Kitchen CINNAMON PO Take by mouth.  . Ginger, Zingiber officinalis, (GINGER PO) Take by mouth.  Marland Kitchen GLUCOSAMINE CHONDROITIN COMPLX PO Take by mouth.  . latanoprost (XALATAN) 0.005 % ophthalmic solution Place 1 drop into the left eye at bedtime.  Marland Kitchen loratadine (CLARITIN) 10 MG tablet Take 10 mg by mouth daily as needed for allergies.  Marland Kitchen LUMIGAN 0.01 % SOLN Place 1 drop into both eyes at bedtime.  . Multiple Vitamin (MULTIVITAMIN ADULT PO) Take by mouth.  . Polyvinyl Alcohol (LIQUID TEARS OP) Place 1 drop into both eyes daily as needed (dry eyes).     Allergies:   Atorvastatin, Ibuprofen, Asa [aspirin], Codeine, and Midol [aspirin-cinnamedrine-caffeine]   Social History   Tobacco Use  . Smoking status: Never Smoker  . Smokeless tobacco: Never Used  Substance Use Topics  . Alcohol use: Yes    Comment: 1-2 annually  . Drug use: No     Family Hx: The patient's family history includes Cancer in her father, maternal uncle, and sister; Diabetes in her sister; Early death (age of onset: 40) in her son; Heart disease in her brother, mother, sister, and sister; Hyperlipidemia in her mother; Kidney disease in her paternal aunt; Mental illness (age of onset: 66) in her son; Stroke in her brother, mother, and sister. There is no history of Breast cancer.  ROS:   Please see the history of present illness.     All other systems reviewed and are negative.   Prior CV studies:   The following studies were reviewed today:  Heart monitor 01/03/17:  Normal sinus rhythm with normal circadian variation  Rare PVCs. Rare PACs.   Normal 30 day event monitor.  Labs/Other Tests and  Data Reviewed:    EKG:  No ECG reviewed.  Recent Labs: No results found for requested labs within last 8760 hours.   Recent Lipid Panel Lab Results  Component Value Date/Time   CHOL 194 03/22/2018 10:00 AM   TRIG 133 03/22/2018 10:00 AM   HDL 56 03/22/2018 10:00 AM   CHOLHDL 3.5 03/22/2018 10:00 AM   CHOLHDL 3.5 11/27/2013 10:51 AM   LDLCALC 111 (H) 03/22/2018 10:00 AM    Wt Readings from Last 3 Encounters:  04/28/20 144 lb 9.6 oz (65.6 kg)  07/11/18 141 lb (64 kg)  06/30/18 141 lb 6 oz (64.1 kg)     Objective:    Vital Signs:  BP (!) 161/95   Pulse 71   Ht 5' 2.5" (1.588  m)   Wt 144 lb 9.6 oz (65.6 kg)   BMI 26.03 kg/m    BP recheck: 132/98  VITAL SIGNS:  reviewed GEN:  no acute distress RESPIRATORY:  respirations unlabored, no cough NEURO:  alert and oriented x 3, no obvious focal deficit PSYCH:  normal affect  ASSESSMENT & PLAN:    PAF - one episode in 2017 during acute illness - no recurrence - no AC, continue 81 mg ASA   Borderline hypertension / elevated BP reading - BP elevated because she rushed in from the driveway  - rechecked and was lower - she will continue to monitor and either reach out to her PCP or [place] if this becomes consistently elevated   Dyslipidemia - PCP follows lipid profile - last lipids in 2020 with elevated triglycerides - recommended diet and exercise first   Follow up in 1 year, or sooner PRN.   COVID-19 Education: The signs and symptoms of COVID-19 were discussed with the patient and how to seek care for testing (follow up with PCP or arrange E-visit).  The importance of social distancing was discussed today.  Time:   Today, I have spent 15 minutes with the patient with telehealth technology discussing the above problems.     Medication Adjustments/Labs and Tests Ordered: Current medicines are reviewed at length with the patient today.  Concerns regarding medicines are outlined above.   Tests Ordered: No orders of  the defined types were placed in this encounter.   Medication Changes: No orders of the defined types were placed in this encounter.   Follow Up:  Either In Person or Virtual in 1 year(s)  Signed, Ledora Bottcher, PA  04/28/2020 10:13 AM    [place]

## 2020-04-28 ENCOUNTER — Telehealth (INDEPENDENT_AMBULATORY_CARE_PROVIDER_SITE_OTHER): Payer: Medicare Other | Admitting: Physician Assistant

## 2020-04-28 ENCOUNTER — Encounter: Payer: Self-pay | Admitting: Physician Assistant

## 2020-04-28 VITALS — BP 161/95 | HR 71 | Ht 62.5 in | Wt 144.6 lb

## 2020-04-28 DIAGNOSIS — E785 Hyperlipidemia, unspecified: Secondary | ICD-10-CM | POA: Diagnosis not present

## 2020-04-28 DIAGNOSIS — R030 Elevated blood-pressure reading, without diagnosis of hypertension: Secondary | ICD-10-CM | POA: Diagnosis not present

## 2020-04-28 DIAGNOSIS — I480 Paroxysmal atrial fibrillation: Secondary | ICD-10-CM

## 2020-04-28 NOTE — Patient Instructions (Signed)
Medication Instructions:  Your physician recommends that you continue on your current medications as directed. Please refer to the Current Medication list given to you today.  *If you need a refill on your cardiac medications before your next appointment, please call your pharmacy*   Follow-Up: At [facility], you and your health needs are our priority.  As part of our continuing mission to provide you with exceptional heart care, we have created designated Provider Care Teams.  These Care Teams include your primary Cardiologist (physician) and Advanced Practice Providers (APPs -  Physician Assistants and Nurse Practitioners) who all work together to provide you with the care you need, when you need it.  We recommend signing up for the patient portal called "MyChart".  Sign up information is provided on this After Visit Summary.  MyChart is used to connect with patients for Virtual Visits (Telemedicine).  Patients are able to view lab/test results, encounter notes, upcoming appointments, etc.  Non-urgent messages can be sent to your provider as well.   To learn more about what you can do with MyChart, go to [email or web address].    Your next appointment:   12 month(s)  The format for your next appointment:   In Person  Provider:   Sanda Klein, MD   Other Instructions You should receive a reminder letter in the mail when it is time to schedule your appointment. If you do not receive a letter, please call our office at [phone] to schedule your appointment at least 2 months in advance.

## 2020-09-24 ENCOUNTER — Other Ambulatory Visit: Payer: Self-pay | Admitting: Physician Assistant

## 2020-09-24 DIAGNOSIS — Z1231 Encounter for screening mammogram for malignant neoplasm of breast: Secondary | ICD-10-CM

## 2020-10-23 ENCOUNTER — Other Ambulatory Visit: Payer: Self-pay

## 2020-10-23 ENCOUNTER — Ambulatory Visit
Admission: RE | Admit: 2020-10-23 | Discharge: 2020-10-23 | Disposition: A | Discharge status: Home / Self Care | Payer: Medicare Other | Source: Ambulatory Visit | Attending: Physician Assistant | Admitting: Physician Assistant

## 2020-10-23 DIAGNOSIS — Z1231 Encounter for screening mammogram for malignant neoplasm of breast: Secondary | ICD-10-CM

## 2020-11-25 ENCOUNTER — Ambulatory Visit
Admission: RE | Admit: 2020-11-25 | Discharge: 2020-11-25 | Disposition: A | Discharge status: Home / Self Care | Payer: Medicare Other | Source: Ambulatory Visit | Attending: Physician Assistant | Admitting: Physician Assistant

## 2020-11-25 ENCOUNTER — Other Ambulatory Visit: Payer: Self-pay

## 2021-12-13 ENCOUNTER — Other Ambulatory Visit: Payer: Self-pay

## 2021-12-13 ENCOUNTER — Encounter: Payer: Self-pay | Admitting: *Deleted

## 2021-12-13 ENCOUNTER — Ambulatory Visit
Admission: EM | Admit: 2021-12-13 | Discharge: 2021-12-13 | Disposition: A | Discharge status: Home / Self Care | Payer: Medicare Other | Attending: Internal Medicine | Admitting: Internal Medicine

## 2021-12-13 DIAGNOSIS — J029 Acute pharyngitis, unspecified: Secondary | ICD-10-CM

## 2021-12-13 DIAGNOSIS — J069 Acute upper respiratory infection, unspecified: Secondary | ICD-10-CM

## 2021-12-13 LAB — POCT RAPID STREP A (OFFICE): Rapid Strep A Screen: NEGATIVE

## 2021-12-13 NOTE — ED Triage Notes (Signed)
Ore throat started today.

## 2021-12-13 NOTE — ED Provider Notes (Signed)
[facility]    CSN: [id number] Arrival date & time: 12/13/21  1353      History   Chief Complaint Chief Complaint  Patient presents with   Sore Throat    HPI Alicia Bass is a 75 y.o. female.   Patient presents with sore throat and nasal congestion that started approximately 1 to 2 days ago.  Patient reports that her sore throat is severe.  Denies any cough, chest pain, shortness of breath, nausea, vomiting, diarrhea, abdominal pain.  Patient has not taken any medications to help alleviate symptoms.   Sore Throat   Past Medical History:  Diagnosis Date   Allergy    Anemia    history of teenager   Arthritis    Basal cell carcinoma    LEFT anterior chest wall   Cystocele with prolapse    DDD (degenerative disc disease), lumbar 2001   Mild   Diverticulosis    Fibromyalgia    GERD (gastroesophageal reflux disease)    Hematuria, microscopic    Hiatal hernia    History of colon polyps    Hyperlipidemia    normal as of 06/30/2018   Influenza 2018   hospitalized   Internal and external hemorrhoids without complication    Migraines    history of    Osteoporosis    PAF (paroxysmal atrial fibrillation) ([place]) 2018   spontaneous conversion NSR   Pneumonia    Pterygium eye    Shingles 2007   Syncope    due to blood loss before hysterectomy    Patient Active Problem List   Diagnosis Date Noted   Cystocele with prolapse 07/11/2018   Hyperlipidemia 12/14/2017   Paroxysmal atrial fibrillation ([place]) 02/23/2017   Pterygium eye 12/22/2014   Abnormal resting ECG findings 01/08/2014   Microscopic hematuria 12/17/2013   Fibromyalgia 12/12/2012   Female cystocele 12/12/2012   Osteoporosis 12/12/2012   Hiatal hernia     Past Surgical History:  Procedure Laterality Date   ABDOMINAL HYSTERECTOMY     CERVICAL CONE BIOPSY     CHOLECYSTECTOMY     COLONOSCOPY W/ POLYPECTOMY  12/12/2012   CYSTOCELE REPAIR N/A 07/11/2018   Procedure: ANTERIOR REPAIR  (CYSTOCELE);  Surgeon: Bjorn Loser, MD;  Location: [facility];  Service: Urology;  Laterality: N/A;   excision     BCC, back   VAGINAL PROLAPSE REPAIR N/A 07/11/2018   Procedure: VAGINAL VAULT SUSPENSION AND GRAFT CYSTOSCOPY;  Surgeon: Bjorn Loser, MD;  Location: [facility];  Service: Urology;  Laterality: N/A;    OB History   No obstetric history on file.      Home Medications    Prior to Admission medications   Medication Sig Start Date End Date Taking? Authorizing Provider  acetaminophen (TYLENOL) 500 MG tablet Take 1,000 mg by mouth daily as needed for moderate pain.    [provider]  alendronate (FOSAMAX) 70 MG tablet Take 70 mg by mouth once a week. Take with a full glass of water on an empty stomach.    [provider]  Ascorbic Acid (VITAMIN C PO) Take by mouth.    [provider]  Aspirin Buf,CaCarb-MgCarb-MgO, 81 MG TABS Take by mouth.    [provider]  calcium carbonate (TUMS - DOSED IN MG ELEMENTAL CALCIUM) 500 MG chewable tablet Chew 1 tablet by mouth daily as needed for indigestion or heartburn.    [provider]  CINNAMON PO Take by mouth.    [provider]  Ginger,  Zingiber officinalis, (GINGER PO) Take by mouth.    [provider]  GLUCOSAMINE CHONDROITIN COMPLX PO Take by mouth.    [provider]  latanoprost (XALATAN) 0.005 % ophthalmic solution Place 1 drop into the left eye at bedtime. 05/24/18   [provider]  loratadine (CLARITIN) 10 MG tablet Take 10 mg by mouth daily as needed for allergies.    [provider]  LUMIGAN 0.01 % SOLN Place 1 drop into both eyes at bedtime. 03/10/20   [provider]  Multiple Vitamin (MULTIVITAMIN ADULT PO) Take by mouth.    [provider]  Polyvinyl Alcohol (LIQUID TEARS OP) Place 1 drop into both eyes daily as needed (dry eyes).    [provider]    Family History Family History  Problem Relation  Age of Onset   Heart disease Mother    Stroke Mother    Hyperlipidemia Mother    Cancer Father    Cancer Sister        "full blown cancer," "it started as a squeamish on her head"   Heart disease Sister    Diabetes Sister    Heart disease Brother    Stroke Brother    Early death Son 23       suicide   Mental illness Son 45       bipolar disorder   Cancer Maternal Uncle    Kidney disease Paternal Aunt    Stroke Sister    Heart disease Sister    Breast cancer Neg Hx     Social History Social History   Tobacco Use   Smoking status: Never   Smokeless tobacco: Never  Vaping Use   Vaping Use: Never used  Substance Use Topics   Alcohol use: Yes    Comment: 1-2 annually   Drug use: No     Allergies   Atorvastatin, Ibuprofen, Asa [aspirin], Codeine, and Midol [aspirin-cinnamedrine-caffeine]   Review of Systems Review of Systems Per HPI  Physical Exam Triage Vital Signs ED Triage Vitals  Enc Vitals Group     BP 12/13/21 1434 (!) 146/85     Pulse Rate 12/13/21 1434 100     Resp 12/13/21 1434 20     Temp 12/13/21 1434 98.8 F (37.1 C)     Temp src --      SpO2 12/13/21 1434 95 %     Weight --      Height --      Head Circumference --      Peak Flow --      Pain Score 12/13/21 1435 0     Pain Loc --      Pain Edu? --      Excl. in [place]? --    No data found.  Updated Vital Signs BP (!) 146/85    Pulse 100    Temp 98.8 F (37.1 C)    Resp 20    SpO2 95%   Visual Acuity Right Eye Distance:   Left Eye Distance:   Bilateral Distance:    Right Eye Near:   Left Eye Near:    Bilateral Near:     Physical Exam Constitutional:      General: She is not in acute distress.    Appearance: Normal appearance. She is not toxic-appearing or diaphoretic.  HENT:     Head: Normocephalic and atraumatic.     Right Ear: Tympanic membrane and ear canal normal.     Left Ear: Tympanic membrane and  ear canal normal.     Nose: Congestion present.     Mouth/Throat:      Mouth: Mucous membranes are moist.     Pharynx: Posterior oropharyngeal erythema present. No pharyngeal swelling, oropharyngeal exudate or uvula swelling.     Tonsils: No tonsillar exudate or tonsillar abscesses.  Eyes:     Extraocular Movements: Extraocular movements intact.     Conjunctiva/sclera: Conjunctivae normal.     Pupils: Pupils are equal, round, and reactive to light.  Cardiovascular:     Rate and Rhythm: Normal rate and regular rhythm.     Pulses: Normal pulses.     Heart sounds: Normal heart sounds.  Pulmonary:     Effort: Pulmonary effort is normal. No respiratory distress.     Breath sounds: Normal breath sounds. No stridor. No wheezing, rhonchi or rales.  Abdominal:     General: Abdomen is flat. Bowel sounds are normal.     Palpations: Abdomen is soft.  Musculoskeletal:        General: Normal range of motion.     Cervical back: Normal range of motion.  Skin:    General: Skin is warm and dry.  Neurological:     General: No focal deficit present.     Mental Status: She is alert and oriented to person, place, and time. Mental status is at baseline.  Psychiatric:        Mood and Affect: Mood normal.        Behavior: Behavior normal.     UC Treatments / Results  Labs (all labs ordered are listed, but only abnormal results are displayed) Labs Reviewed  CULTURE, GROUP A STREP ([place])  COVID-19, FLU A+B NAA  POCT RAPID STREP A (OFFICE)    EKG   Radiology No results found.  Procedures Procedures (including critical care time)  Medications Ordered in UC Medications - No data to display  Initial Impression / Assessment and Plan / UC Course  I have reviewed the triage vital signs and the nursing notes.  Pertinent labs & imaging results that were available during my care of the patient were reviewed by me and considered in my medical decision making (see chart for details).     Patient presents with symptoms likely from a viral upper respiratory infection.  Differential includes bacterial pneumonia, sinusitis, allergic rhinitis, COVID-19, flu. Do not suspect underlying cardiopulmonary process. Symptoms seem unlikely related to ACS, CHF or COPD exacerbations, pneumonia, pneumothorax. Patient is nontoxic appearing and not in need of emergent medical intervention.  Rapid strep is negative.  Throat culture, COVID-19, flu test pending.  Low suspicion for strep throat given appearance of posterior pharynx on exam.  No signs of peritonsillar abscess.  Recommended symptom control with over the counter medications.  Discussed supportive care and symptom management.  Recommend saline nasal sprays, Claritin, Cepacol throat lozenges.  Return if symptoms fail to improve in 1-2 weeks or you develop shortness of breath, chest pain, severe headache. Patient states understanding and is agreeable.  Discharged with PCP followup.  Final Clinical Impressions(s) / UC Diagnoses   Final diagnoses:  Viral upper respiratory infection  Sore throat     Discharge Instructions      It appears that you have a viral infection that is causing her symptoms.  Rapid strep is negative.  Throat culture, COVID-19, flu test is pending.  We will call if it is positive.  Please use saline nasal sprays as well as Claritin.  You may also use Cepacol throat  lozenges that you get over-the-counter.     ED Prescriptions   None    PDMP not reviewed this encounter.   Teodora Medici, [place] 12/13/21 1536

## 2021-12-13 NOTE — Discharge Instructions (Signed)
It appears that you have a viral infection that is causing her symptoms.  Rapid strep is negative.  Throat culture, COVID-19, flu test is pending.  We will call if it is positive.  Please use saline nasal sprays as well as Claritin.  You may also use Cepacol throat lozenges that you get over-the-counter.

## 2021-12-14 LAB — COVID-19, FLU A+B NAA
Influenza A, NAA: NOT DETECTED
Influenza B, NAA: NOT DETECTED
SARS-CoV-2, NAA: DETECTED — AB

## 2021-12-16 LAB — CULTURE, GROUP A STREP (THRC)

## 2023-02-10 ENCOUNTER — Other Ambulatory Visit: Payer: Self-pay

## 2023-02-10 ENCOUNTER — Emergency Department (HOSPITAL_BASED_OUTPATIENT_CLINIC_OR_DEPARTMENT_OTHER): Payer: Medicare Other

## 2023-02-10 ENCOUNTER — Emergency Department (HOSPITAL_BASED_OUTPATIENT_CLINIC_OR_DEPARTMENT_OTHER)
Admission: EM | Admit: 2023-02-10 | Discharge: 2023-02-10 | Disposition: A | Discharge status: Home / Self Care | Payer: Medicare Other | Attending: Emergency Medicine | Admitting: Emergency Medicine

## 2023-02-10 ENCOUNTER — Encounter (HOSPITAL_BASED_OUTPATIENT_CLINIC_OR_DEPARTMENT_OTHER): Payer: Self-pay | Admitting: Urology

## 2023-02-10 DIAGNOSIS — W2209XA Striking against other stationary object, initial encounter: Secondary | ICD-10-CM | POA: Diagnosis not present

## 2023-02-10 DIAGNOSIS — S93601A Unspecified sprain of right foot, initial encounter: Secondary | ICD-10-CM | POA: Insufficient documentation

## 2023-02-10 DIAGNOSIS — M79671 Pain in right foot: Secondary | ICD-10-CM | POA: Diagnosis present

## 2023-02-10 NOTE — ED Triage Notes (Signed)
Right foot injury after jamming foot into cabinet yesterday  +swelling  In ace wrap today for pain relief  Unable to bear weight

## 2023-02-10 NOTE — Discharge Instructions (Signed)
Thank you for letting us care of you today.  Your x-ray did not show any fractures or dislocations in your foot.  With this, I recommend using the brace provided as needed.  If your symptoms begin improving, you may remove this as tolerated.  Please take Tylenol at home for pain.  Is important to practice RICE therapy as indicated in the provided instructions.  If you continue to have symptoms by next week, I recommend that you follow-up with the orthopedic provided or an orthopedic doctor of your own choosing.  If you develop any new injury or other concerns, please return to nearest emergency department for reevaluation.

## 2023-02-11 NOTE — ED Provider Notes (Signed)
[facility] Provider Note   CSN: [id number] Arrival date & time: 02/10/23  1531     History  Chief Complaint  Patient presents with   Foot Injury    Alicia Bass is a 77 y.o. female presenting to ED c/o right foot pain after stubbing foot into cabinet yesterday. She has not been able to weight bear or ambulate without significant difficulty since that time. No previous injury to ankle/foot. She denies falling, hitting her head, or other injury. She denies numbness or tingling.       Home Medications Prior to Admission medications   Medication Sig Start Date End Date Taking? Authorizing Provider  acetaminophen (TYLENOL) 500 MG tablet Take 1,000 mg by mouth daily as needed for moderate pain.    [provider]  alendronate (FOSAMAX) 70 MG tablet Take 70 mg by mouth once a week. Take with a full glass of water on an empty stomach.    [provider]  Ascorbic Acid (VITAMIN C PO) Take by mouth.    [provider]  Aspirin Buf,CaCarb-MgCarb-MgO, 81 MG TABS Take by mouth.    [provider]  calcium carbonate (TUMS - DOSED IN MG ELEMENTAL CALCIUM) 500 MG chewable tablet Chew 1 tablet by mouth daily as needed for indigestion or heartburn.    [provider]  CINNAMON PO Take by mouth.    [provider]  Ginger, Zingiber officinalis, (GINGER PO) Take by mouth.    [provider]  GLUCOSAMINE CHONDROITIN COMPLX PO Take by mouth.    [provider]  latanoprost (XALATAN) 0.005 % ophthalmic solution Place 1 drop into the left eye at bedtime. 05/24/18   [provider]  loratadine (CLARITIN) 10 MG tablet Take 10 mg by mouth daily as needed for allergies.    [provider]  LUMIGAN 0.01 % SOLN Place 1 drop into both eyes at bedtime. 03/10/20   [provider]  Multiple Vitamin (MULTIVITAMIN ADULT PO) Take by mouth.    [provider]   Polyvinyl Alcohol (LIQUID TEARS OP) Place 1 drop into both eyes daily as needed (dry eyes).    [provider]      Allergies    Atorvastatin, Ibuprofen, Asa [aspirin], Codeine, and Midol [aspirin-cinnamedrine-caffeine]    Review of Systems   Review of Systems  All other systems reviewed and are negative.   Physical Exam Updated Vital Signs BP (!) 153/84 (BP Location: Left Arm)   Pulse 71   Temp 97.8 F (36.6 C) (Oral)   Resp 17   Ht 5' 3"$  (1.6 m)   Wt 65.6 kg   SpO2 98%   BMI 25.62 kg/m  Physical Exam Vitals and nursing note reviewed.  Constitutional:      General: She is not in acute distress.    Appearance: Normal appearance. She is not toxic-appearing.  HENT:     Head: Normocephalic and atraumatic.     Mouth/Throat:     Mouth: Mucous membranes are moist.  Eyes:     Conjunctiva/sclera: Conjunctivae normal.  Cardiovascular:     Rate and Rhythm: Normal rate and regular rhythm.     Heart sounds: No murmur heard. Pulmonary:     Effort: Pulmonary effort is normal.     Breath sounds: Normal breath sounds.  Abdominal:     General: Abdomen is flat.     Palpations: Abdomen is soft.  Musculoskeletal:     Cervical back: Neck supple.  Right lower leg: No edema.     Left lower leg: No edema.     Comments: Moderate tenderness over right lateral midfoot, range of motion limited secondary to pain, no significant swelling or signs of joint effusion or deformity, no joint laxity, no overlying skin changes, no tenderness over bilateral ankle malleoli or remainder of ankle joint, 2+ PT and DP pulse, normal sensation, compartments are soft, foot is warm and well perfused, no wounds to plantar/dorsal surface of foot or ankle  Skin:    General: Skin is warm and dry.     Capillary Refill: Capillary refill takes less than 2 seconds.     Findings: No rash.  Neurological:     Mental Status: She is alert. Mental status is at baseline.  Psychiatric:        Behavior:  Behavior normal.     ED Results / Procedures / Treatments   Labs (all labs ordered are listed, but only abnormal results are displayed) Labs Reviewed - No data to display  EKG None  Radiology DG Foot Complete Right  Result Date: 02/10/2023 CLINICAL DATA:  injury to right foot EXAM: RIGHT FOOT COMPLETE - 3+ VIEW COMPARISON:  None Available. FINDINGS: There is no evidence of fracture or dislocation. There is no evidence of arthropathy or other focal bone abnormality. Soft tissues are unremarkable. IMPRESSION: Negative. Electronically Signed   By: Margaretha Sheffield M.D.   On: 02/10/2023 16:24    Procedures Procedures  Pt provided with brace to use at home. She declined crutches as she has these available at home.   Medications Ordered in ED Medications - No data to display  ED Course/ Medical Decision Making/ A&P                             Medical Decision Making Amount and/or Complexity of Data Reviewed Radiology: ordered. Decision-making details documented in ED Course.   Medical Decision Making:   Alicia Bass is a 77 y.o. female who presented to the ED today with foot pain detailed above.    Additional history discussed with patient's family/caregivers.  Complete initial physical exam performed, notably the patient was neurovascularly intact with soft compartments and without deformity.  Reviewed and confirmed nursing documentation for past medical history, family history, social history.    Initial Assessment:   With the patient's presentation of foot pain, most likely diagnosis is foot sprain. Other diagnoses were considered including (but not limited to) fracture, dislocation, compartment syndrome, septic joint. These are considered less likely due to history of present illness and physical exam findings.   This is most consistent with an acute complicated injury  Initial Plan:  XR to evaluate for bony pathology Objective evaluation as reviewed   Initial  Study Results:    Radiology:  All images reviewed independently. Agree with radiology report at this time.   DG Foot Complete Right  Result Date: 02/10/2023 CLINICAL DATA:  injury to right foot EXAM: RIGHT FOOT COMPLETE - 3+ VIEW COMPARISON:  None Available. FINDINGS: There is no evidence of fracture or dislocation. There is no evidence of arthropathy or other focal bone abnormality. Soft tissues are unremarkable. IMPRESSION: Negative. Electronically Signed   By: Margaretha Sheffield M.D.   On: 02/10/2023 16:24      Final Assessment and Plan:   This is a 77 year old female presenting to ED for right ankle pain after mechanical injury as described above. She has  pain most notably over the midfoot but is neurovascularly intact with soft compartments and no deformity or overlying skin changes. XR negative for acute findings. With inability to bear weight, will provide with brace for support. Offered crutches but pt states she has these at home. Discussed results with pt/family and plan for discharge home with orthopedic follow up for any continued symptoms. Pt expressed understanding of this. Return precautions given, all questions answered, and pt stable for discharge.     Clinical Impression:  1. Foot sprain, right, initial encounter      Discharge     Final Clinical Impression(s) / ED Diagnoses Final diagnoses:  Foot sprain, right, initial encounter    Rx / DC Orders ED Discharge Orders     None         Suzzette Righter, PA-C 02/11/23 0520    Margette Fast, MD 02/16/23 [phone]

## 2023-04-05 ENCOUNTER — Encounter: Payer: Self-pay | Admitting: Internal Medicine

## 2023-04-13 ENCOUNTER — Ambulatory Visit (AMBULATORY_SURGERY_CENTER): Payer: Medicare Other | Admitting: *Deleted

## 2023-04-13 ENCOUNTER — Encounter: Payer: Self-pay | Admitting: Internal Medicine

## 2023-04-13 VITALS — Ht 62.0 in | Wt 135.0 lb

## 2023-04-13 DIAGNOSIS — Z86010 Personal history of colonic polyps: Secondary | ICD-10-CM

## 2023-04-13 NOTE — Progress Notes (Signed)
Pt's pre-visit is done over the phone and all paperwork (prep instructions) sent to patient. Pt's name and DOB verified at the beginning of the pre-visit. Pt denies any difficulty with ambulating.  No egg or soy allergy known to patient  No issues known to pt with past sedation with any surgeries or procedures Pt denies having issues being intubated Pt has no issues moving head neck or swallowing No FH of Malignant Hyperthermia Pt is not on diet pills Pt is not on home 02  Pt is not on blood thinners  Pt denies issues with constipation  Pt has frequent issues with constipation RN instructed pt to use Miralax per bottles instructions a week before prep days. Pt states they will Pt is not on dialysis Pt denies any upcoming cardiac testing Pt encouraged to use to use Singlecare or Goodrx to reduce cost  Patient's chart reviewed by Cathlyn Parsons CNRA prior to pre-visit and patient appropriate for the [facility].  Pre-visit completed and red dot placed by patient's name on their procedure day (on provider's schedule).  . Visit by phone Pt states weight is 135 lb Instructions reviewed with pt and pt states understanding. Instructed to review again prior to procedure. Pt states they will.  Instructions sent by mail  Husband passed 2 weeks ago Pt has has of vaginal prolapse currently

## 2023-04-26 ENCOUNTER — Encounter: Payer: Self-pay | Admitting: Internal Medicine

## 2023-04-26 ENCOUNTER — Ambulatory Visit (AMBULATORY_SURGERY_CENTER): Payer: Medicare Other | Admitting: Internal Medicine

## 2023-04-26 VITALS — BP 114/61 | HR 61 | Temp 97.3°F | Resp 11 | Ht 62.0 in | Wt 135.0 lb

## 2023-04-26 DIAGNOSIS — K635 Polyp of colon: Secondary | ICD-10-CM

## 2023-04-26 DIAGNOSIS — Z1211 Encounter for screening for malignant neoplasm of colon: Secondary | ICD-10-CM

## 2023-04-26 DIAGNOSIS — D122 Benign neoplasm of ascending colon: Secondary | ICD-10-CM

## 2023-04-26 MED ORDER — SODIUM CHLORIDE 0.9 % IV SOLN: 500.0000 mL | Freq: Once | INTRAVENOUS | Status: DC

## 2023-04-26 NOTE — Op Note (Signed)
[facility] Patient Name: Alicia Bass Procedure Date: 04/26/2023 9:07 AM MRN: [id number] Endoscopist: Iva Boop , MD, [id number] Age: 77 Referring MD:  Date of Birth: 06-18-1946 Gender: Female Account #: [id number] Procedure:                Colonoscopy Indications:              Screening for colorectal malignant neoplasm, Last                            colonoscopy: 2013 Medicines:                Monitored Anesthesia Care Procedure:                Pre-Anesthesia Assessment:                           - Prior to the procedure, a History and Physical                            was performed, and patient medications and                            allergies were reviewed. The patient's tolerance of                            previous anesthesia was also reviewed. The risks                            and benefits of the procedure and the sedation                            options and risks were discussed with the patient.                            All questions were answered, and informed consent                            was obtained. Prior Anticoagulants: The patient has                            taken no anticoagulant or antiplatelet agents. ASA                            Grade Assessment: II - A patient with mild systemic                            disease. After reviewing the risks and benefits,                            the patient was deemed in satisfactory condition to                            undergo the procedure.  After obtaining informed consent, the colonoscope                            was passed under direct vision. Throughout the                            procedure, the patient's blood pressure, pulse, and                            oxygen saturations were monitored continuously. The                            Olympus PCF-[id number]) Colonoscope was                            introduced through the anus and advanced to  the the                            cecum, identified by appendiceal orifice and                            ileocecal valve. The colonoscopy was performed                            without difficulty. The patient tolerated the                            procedure well. The quality of the bowel                            preparation was good. The ileocecal valve,                            appendiceal orifice, and rectum were photographed.                            The bowel preparation used was Miralax via extended                            prep with split dose instruction. Scope In: 9:21:38 AM Scope Out: 9:42:13 AM Scope Withdrawal Time: 0 hours 15 minutes 57 seconds  Total Procedure Duration: 0 hours 20 minutes 35 seconds  Findings:                 The perianal and digital rectal examinations were                            normal.                           Two flat polyps were found in the ascending colon.                            The polyps were 6 to 8 mm in size. These polyps  were removed with a cold snare. Resection and                            retrieval were complete. Verification of patient                            identification for the specimen was done. Estimated                            blood loss was minimal.                           Multiple diverticula were found in the entire colon.                           The exam was otherwise without abnormality on                            direct and retroflexion views. Complications:            No immediate complications. Estimated Blood Loss:     Estimated blood loss was minimal. Impression:               - Two 6 to 8 mm polyps in the ascending colon,                            removed with a cold snare. Resected and retrieved.                           - Diverticulosis in the entire examined colon.                           - The examination was otherwise normal on direct                             and retroflexion views. Recommendation:           - Patient has a contact number available for                            emergencies. The signs and symptoms of potential                            delayed complications were discussed with the                            patient. Return to normal activities tomorrow.                            Written discharge instructions were provided to the                            patient.                           - Resume previous  diet.                           - Continue present medications.                           - No recommendation at this time regarding repeat                            colonoscopy due to age.                           - Await pathology results. Iva Boop, MD 04/26/2023 9:48:20 AM This report has been signed electronically.

## 2023-04-26 NOTE — Progress Notes (Signed)
Called to room to assist during endoscopic procedure.  Patient ID and intended procedure confirmed with present staff. Received instructions for my participation in the procedure from the performing physician.  

## 2023-04-26 NOTE — Patient Instructions (Addendum)
I found and removed 2 polyps that look benign.  You also have a condition called diverticulosis - common and not usually a problem. Please read the handout provided.  I will let you know pathology results  by mail and/or My Chart. I am not planning to recommend a repeat colonoscopy on a routine basis.  I appreciate the opportunity to care for you. Iva Boop, MD, [facility]  Handouts provided on polyps and diverticulosis.  Resume previous diet.  Continue present medications.  No recommendation at this time regarding repeat colonoscopy due to age.  Await pathology results.   YOU HAD AN ENDOSCOPIC PROCEDURE TODAY AT THE [facility]:   Refer to the procedure report that was given to you for any specific questions about what was found during the examination.  If the procedure report does not answer your questions, please call your gastroenterologist to clarify.  If you requested that your care partner not be given the details of your procedure findings, then the procedure report has been included in a sealed envelope for you to review at your convenience later.  YOU SHOULD EXPECT: Some feelings of bloating in the abdomen. Passage of more gas than usual.  Walking can help get rid of the air that was put into your GI tract during the procedure and reduce the bloating. If you had a lower endoscopy (such as a colonoscopy or flexible sigmoidoscopy) you may notice spotting of blood in your stool or on the toilet paper. If you underwent a bowel prep for your procedure, you may not have a normal bowel movement for a few days.  Please Note:  You might notice some irritation and congestion in your nose or some drainage.  This is from the oxygen used during your procedure.  There is no need for concern and it should clear up in a day or so.  SYMPTOMS TO REPORT IMMEDIATELY:  Following lower endoscopy (colonoscopy or flexible sigmoidoscopy):  Excessive amounts of blood in the stool  Significant  tenderness or worsening of abdominal pains  Swelling of the abdomen that is new, acute  Fever of 100F or higher  For urgent or emergent issues, a gastroenterologist can be reached at any hour by calling [phone]. Do not use MyChart messaging for urgent concerns.    DIET:  We do recommend a small meal at first, but then you may proceed to your regular diet.  Drink plenty of fluids but you should avoid alcoholic beverages for 24 hours.  ACTIVITY:  You should plan to take it easy for the rest of today and you should NOT DRIVE or use heavy machinery until tomorrow (because of the sedation medicines used during the test).    FOLLOW UP: Our staff will call the number listed on your records the next business day following your procedure.  We will call around 7:15- 8:00 am to check on you and address any questions or concerns that you may have regarding the information given to you following your procedure. If we do not reach you, we will leave a message.     If any biopsies were taken you will be contacted by phone or by letter within the next 1-3 weeks.  Please call us at [phone] if you have not heard about the biopsies in 3 weeks.    SIGNATURES/CONFIDENTIALITY: You and/or your care partner have signed paperwork which will be entered into your electronic medical record.  These signatures attest to the fact that that the information  above on your After Visit Summary has been reviewed and is understood.  Full responsibility of the confidentiality of this discharge information lies with you and/or your care-partner.

## 2023-04-26 NOTE — Progress Notes (Signed)
[facility] History and Physical   Primary Care Physician:  Porfirio Oar, PA   Reason for Procedure:   CRCA screening  Plan:    colonoscopy     HPI: Alicia Bass is a 77 y.o. female for screening exam - 11/2012 exam negative  Past Medical History:  Diagnosis Date   Allergy    Anemia    history of teenager   Arthritis    Basal cell carcinoma    LEFT anterior chest wall   Cystocele with prolapse    DDD (degenerative disc disease), lumbar 2001   Mild   Diverticulosis    Fibromyalgia    GERD (gastroesophageal reflux disease)    Hematuria, microscopic    Hiatal hernia    Hyperlipidemia    normal as of 06/30/2018   Influenza 2018   hospitalized   Internal and external hemorrhoids without complication    Migraines    history of    Osteoporosis    PAF (paroxysmal atrial fibrillation) (HCC) 2018   spontaneous conversion NSR   Pneumonia    Pterygium eye    Shingles 2007   Syncope    due to blood loss before hysterectomy    Past Surgical History:  Procedure Laterality Date   ABDOMINAL HYSTERECTOMY     CERVICAL CONE BIOPSY     CHOLECYSTECTOMY     COLONOSCOPY     COLONOSCOPY W/ POLYPECTOMY  12/12/2012   CYSTOCELE REPAIR N/A 07/11/2018   Procedure: ANTERIOR REPAIR (CYSTOCELE);  Surgeon: Alfredo Martinez, MD;  Location: [facility];  Service: Urology;  Laterality: N/A;   excision     BCC, back   VAGINAL PROLAPSE REPAIR N/A 07/11/2018   Procedure: VAGINAL VAULT SUSPENSION AND GRAFT CYSTOSCOPY;  Surgeon: Alfredo Martinez, MD;  Location: [facility];  Service: Urology;  Laterality: N/A;    Prior to Admission medications   Medication Sig Start Date End Date Taking? Authorizing Provider  alendronate (FOSAMAX) 70 MG tablet Take 70 mg by mouth once a week. Take with a full glass of water on an empty stomach.   Yes [provider]  Ascorbic Acid (VITAMIN C PO) Take by mouth.   Yes [provider]  Aspirin Buf,CaCarb-MgCarb-MgO, 81 MG TABS Take  by mouth.   Yes [provider]  calcium carbonate (TUMS - DOSED IN MG ELEMENTAL CALCIUM) 500 MG chewable tablet Chew 1 tablet by mouth daily as needed for indigestion or heartburn.   Yes [provider]  CINNAMON PO Take by mouth.   Yes [provider]  Ginger, Zingiber officinalis, (GINGER PO) Take by mouth.   Yes [provider]  GLUCOSAMINE CHONDROITIN COMPLX PO Take by mouth.   Yes [provider]  latanoprost (XALATAN) 0.005 % ophthalmic solution Place 1 drop into the left eye at bedtime. 05/24/18  Yes [provider]  loratadine (CLARITIN) 10 MG tablet Take 10 mg by mouth daily as needed for allergies.   Yes [provider]  LUMIGAN 0.01 % SOLN Place 1 drop into both eyes at bedtime. 03/10/20  Yes [provider]  Multiple Vitamin (MULTIVITAMIN ADULT PO) Take by mouth.   Yes [provider]  Polyvinyl Alcohol (LIQUID TEARS OP) Place 1 drop into both eyes daily as needed (dry eyes).   Yes [provider]  rosuvastatin (CRESTOR) 5 MG tablet Take 5 mg by mouth daily.   Yes [provider]  acetaminophen (TYLENOL) 500 MG tablet Take 1,000 mg by mouth daily as needed for moderate pain.  [provider]    Current Outpatient Medications  Medication Sig Dispense Refill   alendronate (FOSAMAX) 70 MG tablet Take 70 mg by mouth once a week. Take with a full glass of water on an empty stomach.     Ascorbic Acid (VITAMIN C PO) Take by mouth.     Aspirin Buf,CaCarb-MgCarb-MgO, 81 MG TABS Take by mouth.     calcium carbonate (TUMS - DOSED IN MG ELEMENTAL CALCIUM) 500 MG chewable tablet Chew 1 tablet by mouth daily as needed for indigestion or heartburn.     CINNAMON PO Take by mouth.     Ginger, Zingiber officinalis, (GINGER PO) Take by mouth.     GLUCOSAMINE CHONDROITIN COMPLX PO Take by mouth.     latanoprost (XALATAN) 0.005 % ophthalmic solution Place 1 drop into the left eye at bedtime.  3    loratadine (CLARITIN) 10 MG tablet Take 10 mg by mouth daily as needed for allergies.     LUMIGAN 0.01 % SOLN Place 1 drop into both eyes at bedtime.     Multiple Vitamin (MULTIVITAMIN ADULT PO) Take by mouth.     Polyvinyl Alcohol (LIQUID TEARS OP) Place 1 drop into both eyes daily as needed (dry eyes).     rosuvastatin (CRESTOR) 5 MG tablet Take 5 mg by mouth daily.     acetaminophen (TYLENOL) 500 MG tablet Take 1,000 mg by mouth daily as needed for moderate pain.     Current Facility-Administered Medications  Medication Dose Route Frequency Provider Last Rate Last Admin   0.9 %  sodium chloride infusion  500 mL Intravenous Once Iva Boop, MD        Allergies as of 04/26/2023 - Review Complete 04/26/2023  Allergen Reaction Noted   Atorvastatin  03/22/2018   Ibuprofen  06/30/2018   Pecan nut (diagnostic) Other (See Comments) 04/26/2023   Asa [aspirin] Other (See Comments) 12/22/2014   Codeine Other (See Comments) 12/12/2012   Midol [aspirin-cinnamedrine-caffeine] Palpitations 08/25/2014    Family History  Problem Relation Age of Onset   Heart disease Mother    Stroke Mother    Hyperlipidemia Mother    Cancer Father    Colon polyps Sister    Cancer Sister        "full blown cancer," "it started as a squeamish on her head"   Heart disease Sister    Diabetes Sister    Stroke Sister    Heart disease Sister    Heart disease Brother    Stroke Brother    Cancer Maternal Uncle    Kidney disease Paternal Aunt    Stomach cancer Maternal Grandmother    Early death Son 79       suicide   Mental illness Son 55       bipolar disorder   Breast cancer Neg Hx    Cystic fibrosis Neg Hx    Esophageal cancer Neg Hx    Rectal cancer Neg Hx     Social History   Socioeconomic History   Marital status: Married    Spouse name: Alicia Bass   Number of children: 5   Years of education: 12   Highest education level: Some college, no degree  Occupational History    Occupation: Sport and exercise psychologist    Comment: 31 years   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  Upon her retirement 11/2016, she moved her mother into their home, and cared for her until her death in 05-05-17.    Square dances, bowls and rides motorcycles with her husband, though much less so since his health decline.    Her daughter and 2 grandchildren live with them.     Her son died by suicide at age 65.     Her husband also has 3 children from a previous relationship.   Social Determinants of Health   Financial Resource Strain: Low Risk  (10/23/2019)   Overall Financial Resource Strain (CARDIA)    Difficulty of Paying Living Expenses: Not hard at all  Food Insecurity: No Food Insecurity (10/23/2019)   Hunger Vital Sign    Worried About Running Out of Food in the Last Year: Never true    Ran Out of Food in the Last Year: Never true  Transportation Needs: No Transportation Needs (10/23/2019)   PRAPARE - Administrator, Civil Service (Medical): No    Lack of Transportation (Non-Medical): No  Physical Activity: Sufficiently Active (10/23/2019)   Exercise Vital Sign    Days of Exercise per Week: 7 days    Minutes of Exercise per Session: 60 min  Stress: No Stress Concern Present (10/23/2019)   [employer] of Occupational Health - Occupational Stress Questionnaire    Feeling of Stress : Not at all  Social Connections: Socially Integrated (10/23/2019)   Social Connection and Isolation Panel [NHANES]    Frequency of Communication with Friends and Family:  More than three times a week    Frequency of Social Gatherings with Friends and Family: More than three times a week    Attends Religious Services: More than 4 times per year    Active Member of [employer] or Organizations: Yes    Attends Engineer, structural: More than 4 times per year    Marital Status: Married  Catering manager Violence: Not At Risk (10/23/2019)   Humiliation, Afraid, Rape, and Kick questionnaire    Fear of Current or Ex-Partner: No    Emotionally Abused: No    Physically Abused: No    Sexually Abused: No    Review of Systems:  All other review of systems negative except as mentioned in the HPI.  Physical Exam: Vital signs BP (!) 145/75   Pulse 77   Temp (!) 97.3 F (36.3 C) (Temporal)   Resp (!) 21   Ht 5\' 2"  (1.575 m)   Wt 135 lb (61.2 kg)   SpO2 100%   BMI 24.69 kg/m   General:   Alert,  Well-developed, well-nourished, pleasant and cooperative in NAD Lungs:  Clear throughout to auscultation.   Heart:  Regular rate and rhythm; no murmurs, clicks, rubs,  or gallops. Abdomen:  Soft, nontender and nondistended. Normal bowel sounds.   Neuro/Psych:  Alert and cooperative. Normal mood and affect. A and O x 3   @Nishant Schrecengost  Sena Slate, MD, [facility] [phone] (pager) 04/26/2023 9:15 AM@

## 2023-04-26 NOTE — Progress Notes (Signed)
VS completed by DT.  Pt's states no medical or surgical changes since previsit or office visit.  

## 2023-04-26 NOTE — Progress Notes (Signed)
Uneventful anesthetic. Report to pacu rn. Vss. Care resumed by rn. 

## 2023-04-27 ENCOUNTER — Telehealth: Payer: Self-pay

## 2023-04-27 NOTE — Telephone Encounter (Signed)
  Follow up Call-     04/26/2023    8:28 AM  Call back number  Post procedure Call Back phone  # [phone]  Permission to leave phone message Yes     Patient questions:  Do you have a fever, pain , or abdominal swelling? No. Pain Score  0 *  Have you tolerated food without any problems? Yes.    Have you been able to return to your normal activities? Yes.    Do you have any questions about your discharge instructions: Diet   No. Medications  No. Follow up visit  No.  Do you have questions or concerns about your Care? No.  Actions: * If pain score is 4 or above: No action needed, pain <4.

## 2023-05-08 ENCOUNTER — Encounter: Payer: Self-pay | Admitting: Internal Medicine
